# Patient Record
Sex: Female | Born: 1950 | Race: White | Hispanic: No | Smoking: Never smoker
Health system: Southern US, Community
[De-identification: ages and names within clinical notes are randomized; demographics above are authoritative.]

## PROBLEM LIST (undated history)

## (undated) DIAGNOSIS — M1990 Unspecified osteoarthritis, unspecified site: Secondary | ICD-10-CM

## (undated) DIAGNOSIS — M810 Age-related osteoporosis without current pathological fracture: Secondary | ICD-10-CM

## (undated) DIAGNOSIS — F329 Major depressive disorder, single episode, unspecified: Secondary | ICD-10-CM

## (undated) DIAGNOSIS — K219 Gastro-esophageal reflux disease without esophagitis: Secondary | ICD-10-CM

## (undated) DIAGNOSIS — T7840XA Allergy, unspecified, initial encounter: Secondary | ICD-10-CM

## (undated) DIAGNOSIS — E079 Disorder of thyroid, unspecified: Secondary | ICD-10-CM

## (undated) DIAGNOSIS — F419 Anxiety disorder, unspecified: Secondary | ICD-10-CM

## (undated) DIAGNOSIS — E785 Hyperlipidemia, unspecified: Secondary | ICD-10-CM

## (undated) HISTORY — DX: Major depressive disorder, single episode, unspecified: F32.9

## (undated) HISTORY — DX: Gastro-esophageal reflux disease without esophagitis: K21.9

## (undated) HISTORY — DX: Disorder of thyroid, unspecified: E07.9

## (undated) HISTORY — DX: Age-related osteoporosis without current pathological fracture: M81.0

## (undated) HISTORY — DX: Unspecified osteoarthritis, unspecified site: M19.90

## (undated) HISTORY — PX: BACK SURGERY: SHX140

## (undated) HISTORY — PX: FOOT SURGERY: SHX648

## (undated) HISTORY — PX: COLONOSCOPY: SHX174

## (undated) HISTORY — PX: CERVICAL SPINE SURGERY: SHX589

## (undated) HISTORY — DX: Hyperlipidemia, unspecified: E78.5

## (undated) HISTORY — DX: Allergy, unspecified, initial encounter: T78.40XA

## (undated) HISTORY — DX: Anxiety disorder, unspecified: F41.9

---

## 2003-03-11 ENCOUNTER — Encounter: Admission: RE | Admit: 2003-03-11 | Discharge: 2003-03-11 | Payer: Self-pay | Admitting: Optometry

## 2003-04-05 ENCOUNTER — Emergency Department (HOSPITAL_COMMUNITY): Admission: EM | Admit: 2003-04-05 | Discharge: 2003-04-05 | Payer: Self-pay | Admitting: Family Medicine

## 2003-07-15 ENCOUNTER — Emergency Department (HOSPITAL_COMMUNITY): Admission: EM | Admit: 2003-07-15 | Discharge: 2003-07-15 | Payer: Self-pay | Admitting: Emergency Medicine

## 2003-07-16 ENCOUNTER — Emergency Department (HOSPITAL_COMMUNITY): Admission: EM | Admit: 2003-07-16 | Discharge: 2003-07-16 | Payer: Self-pay | Admitting: Emergency Medicine

## 2005-02-13 ENCOUNTER — Emergency Department (HOSPITAL_COMMUNITY): Admission: EM | Admit: 2005-02-13 | Discharge: 2005-02-13 | Payer: Self-pay | Admitting: Family Medicine

## 2005-02-19 ENCOUNTER — Encounter: Admission: RE | Admit: 2005-02-19 | Discharge: 2005-02-19 | Payer: Self-pay | Admitting: Orthopaedic Surgery

## 2005-03-03 ENCOUNTER — Encounter: Admission: RE | Admit: 2005-03-03 | Discharge: 2005-03-03 | Payer: Self-pay | Admitting: Orthopaedic Surgery

## 2005-11-10 ENCOUNTER — Encounter: Admission: RE | Admit: 2005-11-10 | Discharge: 2005-11-10 | Payer: Self-pay | Admitting: Orthopaedic Surgery

## 2005-11-21 ENCOUNTER — Encounter: Admission: RE | Admit: 2005-11-21 | Discharge: 2005-11-21 | Payer: Self-pay | Admitting: Orthopaedic Surgery

## 2006-03-09 ENCOUNTER — Encounter: Admission: RE | Admit: 2006-03-09 | Discharge: 2006-03-09 | Payer: Self-pay | Admitting: Orthopaedic Surgery

## 2006-03-16 ENCOUNTER — Ambulatory Visit (HOSPITAL_COMMUNITY): Admission: RE | Admit: 2006-03-16 | Discharge: 2006-03-17 | Payer: Self-pay | Admitting: Orthopaedic Surgery

## 2006-03-31 ENCOUNTER — Encounter: Admission: RE | Admit: 2006-03-31 | Discharge: 2006-03-31 | Payer: Self-pay | Admitting: Orthopedic Surgery

## 2006-11-03 ENCOUNTER — Ambulatory Visit (HOSPITAL_COMMUNITY): Admission: RE | Admit: 2006-11-03 | Discharge: 2006-11-03 | Payer: Self-pay | Admitting: Orthopaedic Surgery

## 2010-03-17 ENCOUNTER — Encounter: Payer: Self-pay | Admitting: Neurosurgery

## 2010-03-17 ENCOUNTER — Encounter: Payer: Self-pay | Admitting: Orthopaedic Surgery

## 2010-03-17 ENCOUNTER — Encounter: Payer: Self-pay | Admitting: Orthopedic Surgery

## 2010-05-09 ENCOUNTER — Other Ambulatory Visit: Payer: Self-pay | Admitting: *Deleted

## 2010-05-09 DIAGNOSIS — R51 Headache: Secondary | ICD-10-CM

## 2010-05-15 ENCOUNTER — Other Ambulatory Visit: Payer: Self-pay | Admitting: Otolaryngology

## 2010-05-15 ENCOUNTER — Ambulatory Visit
Admission: RE | Admit: 2010-05-15 | Discharge: 2010-05-15 | Disposition: A | Discharge status: Home / Self Care | Payer: PRIVATE HEALTH INSURANCE | Source: Ambulatory Visit | Attending: Otolaryngology | Admitting: Otolaryngology

## 2010-05-15 ENCOUNTER — Other Ambulatory Visit: Payer: PRIVATE HEALTH INSURANCE

## 2010-05-15 ENCOUNTER — Other Ambulatory Visit: Payer: Self-pay | Admitting: *Deleted

## 2010-05-15 DIAGNOSIS — R51 Headache: Secondary | ICD-10-CM

## 2010-05-15 DIAGNOSIS — R52 Pain, unspecified: Secondary | ICD-10-CM

## 2010-05-16 ENCOUNTER — Other Ambulatory Visit: Payer: Self-pay | Admitting: Otolaryngology

## 2010-05-16 DIAGNOSIS — R52 Pain, unspecified: Secondary | ICD-10-CM

## 2010-05-21 ENCOUNTER — Other Ambulatory Visit: Payer: Self-pay | Admitting: Otolaryngology

## 2010-05-21 DIAGNOSIS — R05 Cough: Secondary | ICD-10-CM

## 2010-05-21 DIAGNOSIS — R059 Cough, unspecified: Secondary | ICD-10-CM

## 2010-07-11 ENCOUNTER — Ambulatory Visit: Payer: PRIVATE HEALTH INSURANCE | Attending: Orthopaedic Surgery | Admitting: Physical Therapy

## 2010-07-11 DIAGNOSIS — R5381 Other malaise: Secondary | ICD-10-CM | POA: Insufficient documentation

## 2010-07-11 DIAGNOSIS — R293 Abnormal posture: Secondary | ICD-10-CM | POA: Insufficient documentation

## 2010-07-11 DIAGNOSIS — IMO0001 Reserved for inherently not codable concepts without codable children: Secondary | ICD-10-CM | POA: Insufficient documentation

## 2010-07-11 DIAGNOSIS — M545 Low back pain, unspecified: Secondary | ICD-10-CM | POA: Insufficient documentation

## 2010-07-12 NOTE — Op Note (Signed)
NAMEJENAVIVE, LAMBOY                ACCOUNT NO.:  [id number]   MEDICAL RECORD NO.:  [id number]          PATIENT TYPE:  OIB   LOCATION:  5015                         FACILITY:  [facility]   PHYSICIAN:  Mark C. Ophelia Charter, M.D.    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   PROCEDURE:  C5-6 anterior cervical diskectomy and fusion with allograft  and plating.   SURGEON:  Mark C. Ophelia Charter, M.D.   ASSISTANT:  Wende Neighbors, P.A.-C.   ANESTHESIA:  General.   ESTIMATED BLOOD LOSS:  Minimal.   DESCRIPTION OF PROCEDURE:  After induction of general anesthesia and  orotracheal intubation, preoperative antibiotics, head halter,  horseshoe, DuraPrep.  Area was squared with towels.  Sterile skin  marker.  Betadine Vi-Drape application and thyroid sheets and drapes.  Incision made starting at the midline and extending to the left.  The  platysma was split in line with the fibers.  Blunt dissection down to  the level of the longus colli, with spreading and a short 25 needle in  the C5-6 disk space, confirming the appropriate level.  Self-retaining  Cloward retractors were placed right and left, after the cross-table  lateral x-ray confirmed this was the C5-6 level.  Diskectomy was  performed with a 15 blade and micropituitary as well as Cloward curets.  Endplates were prepared with the power bur with irrigation, removing the  anterior spurs, and most of the endplate was prepared by hand curetting.  The posterior longitudinal ligament was taken down and there was disk  material medially that protected out from the left side, which was  subligamentous and was a large fragment.  Once this was decompressed,  the dura was completely visualized.  There  was a small amount of  protrusion on the right side.  After decompression and stripping both  gutters, sizing and preparation, with the 7 giving the appropriate fit,  an 18-mm plate was selected and initially was slight too high, adjusted  distally, and then was a little bit too low, and then adjusted back up  to the proper position, and then all screws were filled using the Biomet  plate with locking screws.  This was a VueLock plate with 40-HK screws.  The microscope was draped during the microdiskectomy, taking down the  posterior longitudinal ligament and removing the disk fragments.  The  scope was removed, while placing the interbody graft and plate and screw  fixation.  Appropriate position was confirmed with C-arm, A/P and  lateral, and after irrigation with saline solution, a Hemovac was placed  in line with the skin incision with in-and-out technique.  The platysma  was closed with 3-0 and 4-0 Vicryl subcuticular closure.  Tincture of  benzoin and Steri-Strips, postop dressing, tape and a soft cervical  collar.      Mark C. Ophelia Charter, M.D.  Electronically Signed  MCY/MEDQ  D:  03/16/2006  T:  03/16/2006  Job:  [id number]

## 2010-07-17 ENCOUNTER — Encounter: Payer: PRIVATE HEALTH INSURANCE | Admitting: Physical Therapy

## 2010-07-19 ENCOUNTER — Ambulatory Visit: Payer: PRIVATE HEALTH INSURANCE | Admitting: Rehabilitative and Restorative Service Providers"

## 2010-07-23 ENCOUNTER — Encounter: Payer: PRIVATE HEALTH INSURANCE | Admitting: Physical Therapy

## 2010-07-25 ENCOUNTER — Encounter: Payer: PRIVATE HEALTH INSURANCE | Admitting: Physical Therapy

## 2010-07-29 ENCOUNTER — Ambulatory Visit: Payer: PRIVATE HEALTH INSURANCE | Attending: Orthopaedic Surgery | Admitting: Physical Therapy

## 2010-07-29 DIAGNOSIS — R293 Abnormal posture: Secondary | ICD-10-CM | POA: Insufficient documentation

## 2010-07-29 DIAGNOSIS — R5381 Other malaise: Secondary | ICD-10-CM | POA: Insufficient documentation

## 2010-07-29 DIAGNOSIS — M545 Low back pain, unspecified: Secondary | ICD-10-CM | POA: Insufficient documentation

## 2010-07-29 DIAGNOSIS — IMO0001 Reserved for inherently not codable concepts without codable children: Secondary | ICD-10-CM | POA: Insufficient documentation

## 2010-07-31 ENCOUNTER — Ambulatory Visit: Payer: PRIVATE HEALTH INSURANCE | Admitting: Physical Therapy

## 2010-08-01 ENCOUNTER — Other Ambulatory Visit (HOSPITAL_COMMUNITY): Payer: Self-pay | Admitting: Orthopaedic Surgery

## 2010-08-01 DIAGNOSIS — M545 Low back pain: Secondary | ICD-10-CM

## 2010-08-05 ENCOUNTER — Ambulatory Visit: Payer: PRIVATE HEALTH INSURANCE | Admitting: Physical Therapy

## 2010-08-08 ENCOUNTER — Ambulatory Visit (HOSPITAL_COMMUNITY)
Admission: RE | Admit: 2010-08-08 | Discharge: 2010-08-08 | Disposition: A | Discharge status: Home / Self Care | Payer: PRIVATE HEALTH INSURANCE | Source: Ambulatory Visit | Attending: Orthopaedic Surgery | Admitting: Orthopaedic Surgery

## 2010-08-08 ENCOUNTER — Encounter: Payer: PRIVATE HEALTH INSURANCE | Admitting: Physical Therapy

## 2010-08-08 DIAGNOSIS — G589 Mononeuropathy, unspecified: Secondary | ICD-10-CM | POA: Insufficient documentation

## 2010-08-08 DIAGNOSIS — M79609 Pain in unspecified limb: Secondary | ICD-10-CM | POA: Insufficient documentation

## 2010-08-08 DIAGNOSIS — M545 Low back pain, unspecified: Secondary | ICD-10-CM | POA: Insufficient documentation

## 2010-08-08 DIAGNOSIS — M51379 Other intervertebral disc degeneration, lumbosacral region without mention of lumbar back pain or lower extremity pain: Secondary | ICD-10-CM | POA: Insufficient documentation

## 2010-08-08 DIAGNOSIS — M5137 Other intervertebral disc degeneration, lumbosacral region: Secondary | ICD-10-CM | POA: Insufficient documentation

## 2010-08-12 ENCOUNTER — Encounter: Payer: PRIVATE HEALTH INSURANCE | Admitting: Physical Therapy

## 2010-08-14 ENCOUNTER — Encounter: Payer: PRIVATE HEALTH INSURANCE | Admitting: Physical Therapy

## 2010-08-23 ENCOUNTER — Emergency Department (HOSPITAL_COMMUNITY)
Admission: EM | Admit: 2010-08-23 | Discharge: 2010-08-23 | Disposition: A | Discharge status: Home / Self Care | Payer: PRIVATE HEALTH INSURANCE | Attending: Emergency Medicine | Admitting: Emergency Medicine

## 2010-08-23 DIAGNOSIS — R197 Diarrhea, unspecified: Secondary | ICD-10-CM | POA: Insufficient documentation

## 2010-08-23 DIAGNOSIS — R109 Unspecified abdominal pain: Secondary | ICD-10-CM | POA: Insufficient documentation

## 2010-08-23 LAB — URINE MICROSCOPIC-ADD ON

## 2010-08-23 LAB — DIFFERENTIAL
Band Neutrophils: 0 % (ref 0–10)
Basophils Relative: 0 % (ref 0–1)
Eosinophils Absolute: 0 10*3/uL (ref 0.0–0.7)
Eosinophils Relative: 0 % (ref 0–5)
Lymphocytes Relative: 4 % — ABNORMAL LOW (ref 12–46)
Lymphs Abs: 0.2 10*3/uL — ABNORMAL LOW (ref 0.7–4.0)
Metamyelocytes Relative: 0 %
Monocytes Relative: 5 % (ref 3–12)
Myelocytes: 0 %
Neutro Abs: 4.9 10*3/uL (ref 1.7–7.7)
Promyelocytes Absolute: 0 %
nRBC: 0 /100 WBC

## 2010-08-23 LAB — COMPREHENSIVE METABOLIC PANEL
Albumin: 3.9 g/dL (ref 3.5–5.2)
BUN: 23 mg/dL (ref 6–23)
Calcium: 9.1 mg/dL (ref 8.4–10.5)
Creatinine, Ser: 0.77 mg/dL (ref 0.50–1.10)
GFR calc Af Amer: >60 mL/min (ref >60)
Potassium: 4.5 mEq/L (ref 3.5–5.1)
Total Protein: 6.6 g/dL (ref 6.0–8.3)

## 2010-08-23 LAB — URINALYSIS, ROUTINE W REFLEX MICROSCOPIC
Glucose, UA: NEGATIVE mg/dL
Nitrite: NEGATIVE
Specific Gravity, Urine: 1.023 (ref 1.005–1.030)
pH: 6 (ref 5.0–8.0)

## 2010-08-23 LAB — CBC
HCT: 41.1 % (ref 36.0–46.0)
Hemoglobin: 14.6 g/dL (ref 12.0–15.0)
MCH: 32.2 pg (ref 26.0–34.0)
MCHC: 35.5 g/dL (ref 30.0–36.0)
MCV: 90.5 fL (ref 78.0–100.0)
RDW: 12.5 % (ref 11.5–15.5)
WBC: 5.4 10*3/uL (ref 4.0–10.5)

## 2010-08-23 LAB — CLOSTRIDIUM DIFFICILE BY PCR: Toxigenic C. Difficile by PCR: NEGATIVE

## 2010-08-26 LAB — STOOL CULTURE

## 2011-05-20 DIAGNOSIS — M533 Sacrococcygeal disorders, not elsewhere classified: Secondary | ICD-10-CM

## 2011-05-20 HISTORY — DX: Sacrococcygeal disorders, not elsewhere classified: M53.3

## 2012-07-12 DIAGNOSIS — M533 Sacrococcygeal disorders, not elsewhere classified: Secondary | ICD-10-CM | POA: Insufficient documentation

## 2012-07-12 HISTORY — DX: Sacrococcygeal disorders, not elsewhere classified: M53.3

## 2012-07-31 DIAGNOSIS — F09 Unspecified mental disorder due to known physiological condition: Secondary | ICD-10-CM | POA: Insufficient documentation

## 2012-10-06 ENCOUNTER — Other Ambulatory Visit: Payer: Self-pay | Admitting: Orthopaedic Surgery

## 2012-10-06 DIAGNOSIS — M549 Dorsalgia, unspecified: Secondary | ICD-10-CM

## 2012-10-14 ENCOUNTER — Other Ambulatory Visit: Payer: PRIVATE HEALTH INSURANCE

## 2012-10-21 ENCOUNTER — Other Ambulatory Visit: Payer: PRIVATE HEALTH INSURANCE

## 2012-10-21 ENCOUNTER — Inpatient Hospital Stay
Admission: RE | Admit: 2012-10-21 | Discharge: 2012-10-21 | Disposition: A | Discharge status: Home / Self Care | Payer: PRIVATE HEALTH INSURANCE | Source: Ambulatory Visit | Attending: Orthopaedic Surgery | Admitting: Orthopaedic Surgery

## 2012-11-03 ENCOUNTER — Ambulatory Visit
Admission: RE | Admit: 2012-11-03 | Discharge: 2012-11-03 | Disposition: A | Discharge status: Home / Self Care | Payer: PRIVATE HEALTH INSURANCE | Source: Ambulatory Visit | Attending: Orthopaedic Surgery | Admitting: Orthopaedic Surgery

## 2012-11-03 VITALS — BP 94/54 | HR 68

## 2012-11-03 DIAGNOSIS — M549 Dorsalgia, unspecified: Secondary | ICD-10-CM

## 2012-11-03 MED ORDER — IOHEXOL 180 MG/ML  SOLN
18.0000 mL | Freq: Once | INTRAMUSCULAR | Status: AC | PRN
  Administered 2012-11-03: 18 mL via INTRATHECAL

## 2012-11-03 MED ORDER — DIAZEPAM 5 MG PO TABS
5.0000 mg | ORAL_TABLET | Freq: Once | ORAL | Status: AC
  Administered 2012-11-03: 5 mg via ORAL

## 2012-11-03 NOTE — Progress Notes (Signed)
Pt states she has been off citalopram for the past 2 days. Discharge instructions explained to pt and husband.

## 2012-11-04 ENCOUNTER — Telehealth: Payer: Self-pay | Admitting: Radiology

## 2012-11-04 NOTE — Telephone Encounter (Signed)
Pt had myelo yesterday and called c/o nausea today. Pt denies any headache but has not restarted her citalopram. Explained it could be side effect of not taking her meds and she should go back on them since she had not resumed it this AM as instructed.

## 2012-12-28 DIAGNOSIS — F411 Generalized anxiety disorder: Secondary | ICD-10-CM

## 2012-12-28 HISTORY — DX: Generalized anxiety disorder: F41.1

## 2013-03-16 ENCOUNTER — Encounter: Payer: Self-pay | Admitting: Podiatrist

## 2013-03-16 ENCOUNTER — Ambulatory Visit (INDEPENDENT_AMBULATORY_CARE_PROVIDER_SITE_OTHER): Payer: No Typology Code available for payment source | Admitting: Podiatrist

## 2013-03-16 VITALS — BP 96/57 | HR 70 | Resp 18

## 2013-03-16 DIAGNOSIS — L03039 Cellulitis of unspecified toe: Secondary | ICD-10-CM

## 2013-03-16 MED ORDER — CEPHALEXIN 500 MG PO CAPS: 500.0000 mg | ORAL_CAPSULE | Freq: Three times a day (TID) | ORAL | Status: DC

## 2013-03-16 NOTE — Patient Instructions (Signed)
Soak Instructions    THE DAY AFTER THE PROCEDURE  Place 1/4 cup of epsom salts in a quart of warm tap water.  Submerge your foot or feet with outer bandage intact for the initial soak; this will allow the bandage to become moist and wet for easy lift off.  Once you remove your bandage, continue to soak in the solution for 20 minutes.  This soak should be done twice a day.  Next, remove your foot or feet from solution, blot dry the affected area and cover.  You may use a band aid large enough to cover the area or use gauze and tape.  Apply other medications to the area as directed by the doctor such as polysporin neosporin.  IF YOUR SKIN BECOMES IRRITATED WHILE USING THESE INSTRUCTIONS, IT IS OKAY TO SWITCH TO  WHITE VINEGAR AND WATER. Or you may use antibacterial soap and water to keep the toe clean   After a couple of days switch to antibacterial soap  ANTIBACTERIAL SOAP INSTRUCTIONS  THE DAY AFTER PROCEDURE     Shower as usual. Before getting out, place a drop of antibacterial liquid soap (Dial) on a wet, clean washcloth.  Gently wipe washcloth over affected area.  Afterward, rinse the area with warm water.  Blot the area dry with a soft cloth and cover with antibiotic ointment (neosporin, polysporin, bacitracin) and band aid or gauze and tape  OR   Place 3-4 drops of antibacterial liquid soap in a quart of warm tap water.  Submerge foot into water for 20 minutes.  If bandage was applied after your procedure, leave on to allow for easy lift off, then remove and continue with soak for the remaining time.  Next, blot area dry with a soft cloth and cover with a bandage.  Apply other medications as directed by your doctor, such as cortisporin otic solution (eardrops) or neosporin antibiotic ointment

## 2013-03-16 NOTE — Progress Notes (Signed)
   Subjective:    Patient ID: Martha Harrell, female    DOB: 05-14-1950, 63 y.o.   MRN: [id number]  HPI my right big toenail has been going on for about a week and sore and tender and is a little red and doesn't hurt with shoes    Review of Systems  Constitutional: Negative.   HENT: Negative.   Eyes: Positive for redness.  Respiratory: Negative.   Cardiovascular: Negative.   Gastrointestinal: Negative.   Endocrine: Negative.   Genitourinary: Positive for frequency.  Musculoskeletal:       Back pain  Skin:       Bulging disc tear L 4 / L 5  Allergic/Immunologic: Negative.   Neurological: Negative.   Hematological: Bruises/bleeds easily.  Psychiatric/Behavioral: Negative.        Objective:   Physical Exam GENERAL APPEARANCE: Alert, conversant. Appropriately groomed. No acute distress.  VASCULAR: Pedal pulses palpable and strong bilateral.  Capillary refill time is immediate to all digits,  Proximal to distal cooling it warm to warm.  Digital hair growth is present bilateral  NEUROLOGIC: sensation is intact epicritically and protectively to 5.07 monofilament at 5/5 sites bilateral.  Light touch is intact bilateral, vibratory sensation intact bilateral, achilles tendon reflex is intact bilateral.  MUSCULOSKELETAL: acceptable muscle strength, tone and stability bilateral.  Intrinsic muscluature intact bilateral.  Rectus appearance of foot and digits noted bilateral.   DERMATOLOGIC: Left hallux nail is red and swollen at the proximal lateral nail border. No active pus or purulence is noted no drainage is seen however the proximal nail fold and laterally is inflamed and irritated The remained her of the dermatological examination is within normal limits.      Assessment & Plan:  Paronychia left hallux nail lateral nail border  Treatment options and alternatives discussed.  Recommended an incision and drainage and patient agreed.  Left hallux was prepped with alcohol and a 1 to 1 mix  of 0.5% marcaine plain and 2% lidocaine plain was administered in a digital block fashion.  The toe was then prepped with betadine solution and exsanguinated.  The offending nail border was then excised and all necrotic tissue was resected.  The area was then cleansed well and antibiotic ointment and a dry sterile dressing was applied.  The patient was dispensed instructions for aftercare.

## 2013-03-21 ENCOUNTER — Telehealth: Payer: Self-pay | Admitting: *Deleted

## 2013-03-21 NOTE — Telephone Encounter (Signed)
PT CALLED BACK.  PT QUESTION WAS HOW LONG DID SHE NEED TO WEAR THE BAND AID?  TOLD PT SHE WOULD WEAR THE BAND AID AS LONG AS THE TOE WAS DRAINING OR UNTIL SHE FELT IT WAS HEALED.  Deniece Rankin CNA

## 2013-03-28 ENCOUNTER — Telehealth: Payer: Self-pay | Admitting: *Deleted

## 2013-03-28 NOTE — Telephone Encounter (Signed)
Pt states the Cephalexin gave her a terrible yeast infection, only has a few more capsules left.  Pt asked if she could stop.  I informed pt, if she only had a 4 capsules left, then stop, I would inform Dr Valentina Lucks and if she wanted her to take the remainder I would call again.  Pt agreed.

## 2013-04-13 ENCOUNTER — Encounter: Payer: Self-pay | Admitting: Podiatry

## 2013-04-13 ENCOUNTER — Ambulatory Visit (INDEPENDENT_AMBULATORY_CARE_PROVIDER_SITE_OTHER): Payer: Medicare Other | Admitting: Podiatry

## 2013-04-13 VITALS — BP 107/67 | HR 73 | Resp 16

## 2013-04-13 DIAGNOSIS — L03039 Cellulitis of unspecified toe: Secondary | ICD-10-CM

## 2013-04-13 NOTE — Progress Notes (Signed)
Subjective:     Patient ID: Martha Harrell, female   DOB: November 09, 1950, 63 y.o.   MRN: [id number]  HPI patient presents stating I was just concerned about this right big toe. States that there is been a little bit of cross formation and redness and she wanted to make sure it's not infected   Review of Systems     Objective:   Physical Exam Neurovascular status intact with mild erythema around the proximal nail fold hallux right side with a small amount of crusted tissue that is localized. No active drainage no proximal erythema edema or lymph node distention    Assessment:     Mild paronychia infection right hallux medial side localized in nature    Plan:     Discussed antibiotics which she would like to avoid at this current time as they give her yeast infections. We will switch soaks to Betadine and she will do that everyday for the next couple weeks and if symptoms do not get better or certainly if they get worse she is to contact us come in and we will start her on oral antibiotics at that time.

## 2013-04-13 NOTE — Patient Instructions (Signed)

## 2013-04-20 ENCOUNTER — Telehealth: Payer: Self-pay | Admitting: *Deleted

## 2013-04-20 MED ORDER — FLUCONAZOLE 150 MG PO TABS: 150.0000 mg | ORAL_TABLET | Freq: Once | ORAL | Status: DC

## 2013-04-20 MED ORDER — CEPHALEXIN 500 MG PO CAPS: 500.0000 mg | ORAL_CAPSULE | Freq: Three times a day (TID) | ORAL | Status: DC

## 2013-04-20 NOTE — Telephone Encounter (Addendum)
Pt states right big toe is no better, was told to call for an antibiotic and Diflucan.  Dr Paulla Dolly orderd Cephalexin 500mg  #30 one capsule po tid, and Diflucan 150mg  #1 one tablet once daily.  Orders electronically sent and called to pt.

## 2013-04-21 ENCOUNTER — Other Ambulatory Visit: Payer: Self-pay | Admitting: Podiatry

## 2013-04-21 ENCOUNTER — Telehealth: Payer: Self-pay | Admitting: *Deleted

## 2013-04-21 MED ORDER — FLUCONAZOLE 150 MG PO TABS: 150.0000 mg | ORAL_TABLET | Freq: Once | ORAL | Status: DC

## 2013-04-21 NOTE — Telephone Encounter (Signed)
Pt states she needs more than 1 Diflucan, because of the severity of the yeast infection, and request 3 total.  DR Paulla Dolly states fine.  Ordered 2 additional Diflucan electronically and informed pt.

## 2013-04-29 ENCOUNTER — Telehealth: Payer: Self-pay | Admitting: *Deleted

## 2013-04-29 NOTE — Telephone Encounter (Signed)
Pt states her toe is not getting any better and she wants to be seen by which ever doctor who can see her first.  I referred to [facility].

## 2013-04-29 NOTE — Telephone Encounter (Signed)
SCHEDULED ON 05/02/13 W/ DR Paulla Dolly

## 2013-05-02 ENCOUNTER — Encounter: Payer: Self-pay | Admitting: Podiatry

## 2013-05-02 ENCOUNTER — Ambulatory Visit (INDEPENDENT_AMBULATORY_CARE_PROVIDER_SITE_OTHER): Payer: Medicare Other | Admitting: Podiatry

## 2013-05-02 VITALS — BP 104/61 | HR 69 | Resp 16

## 2013-05-02 DIAGNOSIS — L03039 Cellulitis of unspecified toe: Secondary | ICD-10-CM

## 2013-05-02 NOTE — Progress Notes (Signed)
Today is the last day of my antibiotic , it has improved some , but it is still not well

## 2013-05-02 NOTE — Progress Notes (Signed)
Subjective:     Patient ID: Martha Harrell, female   DOB: Jan 19, 1951, 62 y.o.   MRN: [id number]  HPI patient presents stating I am just concerned about  big toe on my right foot   Review of Systems     Objective:   Physical Exam Neurovascular status intact with mild redness around the proximal nail fold right medial side with no edema no drainage odor or pain when pressed with no proximal edema erythema or lymph node distention    Assessment:     Possible very mild paronychia infection versus normal discoloration of the proximal nail fold    Plan:     No significant clinical symptoms so we will continue to watch this and if any increased redness drainage pain should occur patient is to reappoint

## 2013-05-06 ENCOUNTER — Other Ambulatory Visit: Payer: Self-pay | Admitting: Podiatry

## 2013-06-28 ENCOUNTER — Other Ambulatory Visit: Payer: Self-pay | Admitting: Physician Assistant

## 2013-09-22 DIAGNOSIS — M51379 Other intervertebral disc degeneration, lumbosacral region without mention of lumbar back pain or lower extremity pain: Secondary | ICD-10-CM

## 2013-09-22 DIAGNOSIS — M545 Low back pain, unspecified: Secondary | ICD-10-CM | POA: Insufficient documentation

## 2013-09-22 DIAGNOSIS — M5137 Other intervertebral disc degeneration, lumbosacral region: Secondary | ICD-10-CM

## 2013-09-22 HISTORY — DX: Other intervertebral disc degeneration, lumbosacral region without mention of lumbar back pain or lower extremity pain: M51.379

## 2013-09-22 HISTORY — DX: Other intervertebral disc degeneration, lumbosacral region: M51.37

## 2013-09-22 HISTORY — DX: Low back pain, unspecified: M54.50

## 2015-02-27 DIAGNOSIS — F411 Generalized anxiety disorder: Secondary | ICD-10-CM | POA: Diagnosis not present

## 2015-02-27 DIAGNOSIS — F331 Major depressive disorder, recurrent, moderate: Secondary | ICD-10-CM | POA: Diagnosis not present

## 2015-03-07 DIAGNOSIS — F329 Major depressive disorder, single episode, unspecified: Secondary | ICD-10-CM | POA: Diagnosis not present

## 2015-03-12 DIAGNOSIS — Z0000 Encounter for general adult medical examination without abnormal findings: Secondary | ICD-10-CM | POA: Diagnosis not present

## 2015-03-12 DIAGNOSIS — E7800 Pure hypercholesterolemia, unspecified: Secondary | ICD-10-CM | POA: Diagnosis not present

## 2015-03-14 DIAGNOSIS — K589 Irritable bowel syndrome without diarrhea: Secondary | ICD-10-CM

## 2015-03-14 DIAGNOSIS — E042 Nontoxic multinodular goiter: Secondary | ICD-10-CM

## 2015-03-14 DIAGNOSIS — E785 Hyperlipidemia, unspecified: Secondary | ICD-10-CM

## 2015-03-14 DIAGNOSIS — N952 Postmenopausal atrophic vaginitis: Secondary | ICD-10-CM | POA: Insufficient documentation

## 2015-03-14 DIAGNOSIS — K219 Gastro-esophageal reflux disease without esophagitis: Secondary | ICD-10-CM | POA: Insufficient documentation

## 2015-03-14 DIAGNOSIS — R519 Headache, unspecified: Secondary | ICD-10-CM

## 2015-03-14 DIAGNOSIS — E2740 Unspecified adrenocortical insufficiency: Secondary | ICD-10-CM

## 2015-03-14 DIAGNOSIS — R42 Dizziness and giddiness: Secondary | ICD-10-CM

## 2015-03-14 DIAGNOSIS — J309 Allergic rhinitis, unspecified: Secondary | ICD-10-CM

## 2015-03-14 DIAGNOSIS — M810 Age-related osteoporosis without current pathological fracture: Secondary | ICD-10-CM

## 2015-03-14 DIAGNOSIS — I6529 Occlusion and stenosis of unspecified carotid artery: Secondary | ICD-10-CM

## 2015-03-14 HISTORY — DX: Dizziness and giddiness: R42

## 2015-03-14 HISTORY — DX: Occlusion and stenosis of unspecified carotid artery: I65.29

## 2015-03-14 HISTORY — DX: Irritable bowel syndrome, unspecified: K58.9

## 2015-03-14 HISTORY — DX: Age-related osteoporosis without current pathological fracture: M81.0

## 2015-03-14 HISTORY — DX: Allergic rhinitis, unspecified: J30.9

## 2015-03-14 HISTORY — DX: Headache, unspecified: R51.9

## 2015-03-14 HISTORY — DX: Unspecified adrenocortical insufficiency: E27.40

## 2015-03-14 HISTORY — DX: Hyperlipidemia, unspecified: E78.5

## 2015-03-14 HISTORY — DX: Nontoxic multinodular goiter: E04.2

## 2015-03-15 DIAGNOSIS — I6523 Occlusion and stenosis of bilateral carotid arteries: Secondary | ICD-10-CM | POA: Diagnosis not present

## 2015-03-15 DIAGNOSIS — K219 Gastro-esophageal reflux disease without esophagitis: Secondary | ICD-10-CM | POA: Diagnosis not present

## 2015-03-15 DIAGNOSIS — E7800 Pure hypercholesterolemia, unspecified: Secondary | ICD-10-CM | POA: Diagnosis not present

## 2015-03-15 DIAGNOSIS — Z0000 Encounter for general adult medical examination without abnormal findings: Secondary | ICD-10-CM | POA: Diagnosis not present

## 2015-03-15 DIAGNOSIS — M810 Age-related osteoporosis without current pathological fracture: Secondary | ICD-10-CM | POA: Diagnosis not present

## 2015-03-15 DIAGNOSIS — F332 Major depressive disorder, recurrent severe without psychotic features: Secondary | ICD-10-CM | POA: Diagnosis not present

## 2015-03-15 DIAGNOSIS — F411 Generalized anxiety disorder: Secondary | ICD-10-CM | POA: Diagnosis not present

## 2015-03-15 DIAGNOSIS — J302 Other seasonal allergic rhinitis: Secondary | ICD-10-CM | POA: Diagnosis not present

## 2015-03-21 DIAGNOSIS — Z780 Asymptomatic menopausal state: Secondary | ICD-10-CM | POA: Diagnosis not present

## 2015-03-21 DIAGNOSIS — Z13820 Encounter for screening for osteoporosis: Secondary | ICD-10-CM | POA: Diagnosis not present

## 2015-03-21 DIAGNOSIS — G894 Chronic pain syndrome: Secondary | ICD-10-CM | POA: Diagnosis not present

## 2015-03-21 DIAGNOSIS — M810 Age-related osteoporosis without current pathological fracture: Secondary | ICD-10-CM | POA: Diagnosis not present

## 2015-03-21 DIAGNOSIS — M5137 Other intervertebral disc degeneration, lumbosacral region: Secondary | ICD-10-CM | POA: Diagnosis not present

## 2015-03-21 DIAGNOSIS — G8929 Other chronic pain: Secondary | ICD-10-CM | POA: Diagnosis not present

## 2015-03-21 DIAGNOSIS — M533 Sacrococcygeal disorders, not elsewhere classified: Secondary | ICD-10-CM | POA: Diagnosis not present

## 2015-03-21 DIAGNOSIS — M545 Low back pain: Secondary | ICD-10-CM | POA: Diagnosis not present

## 2015-03-30 DIAGNOSIS — R87615 Unsatisfactory cytologic smear of cervix: Secondary | ICD-10-CM | POA: Diagnosis not present

## 2015-03-30 DIAGNOSIS — Z01419 Encounter for gynecological examination (general) (routine) without abnormal findings: Secondary | ICD-10-CM | POA: Diagnosis not present

## 2015-04-30 DIAGNOSIS — M810 Age-related osteoporosis without current pathological fracture: Secondary | ICD-10-CM | POA: Diagnosis not present

## 2015-05-16 DIAGNOSIS — M7742 Metatarsalgia, left foot: Secondary | ICD-10-CM | POA: Diagnosis not present

## 2015-05-16 DIAGNOSIS — M79672 Pain in left foot: Secondary | ICD-10-CM | POA: Diagnosis not present

## 2015-05-17 DIAGNOSIS — F424 Excoriation (skin-picking) disorder: Secondary | ICD-10-CM | POA: Diagnosis not present

## 2015-05-19 DIAGNOSIS — Z86010 Personal history of colonic polyps: Secondary | ICD-10-CM

## 2015-05-19 DIAGNOSIS — Z860101 Personal history of adenomatous and serrated colon polyps: Secondary | ICD-10-CM

## 2015-05-19 HISTORY — DX: Personal history of adenomatous and serrated colon polyps: Z86.0101

## 2015-05-19 HISTORY — DX: Personal history of colonic polyps: Z86.010

## 2015-06-06 DIAGNOSIS — D131 Benign neoplasm of stomach: Secondary | ICD-10-CM

## 2015-06-06 HISTORY — DX: Benign neoplasm of stomach: D13.1

## 2015-06-20 DIAGNOSIS — J302 Other seasonal allergic rhinitis: Secondary | ICD-10-CM | POA: Diagnosis not present

## 2015-06-27 DIAGNOSIS — L820 Inflamed seborrheic keratosis: Secondary | ICD-10-CM | POA: Diagnosis not present

## 2015-06-27 DIAGNOSIS — L821 Other seborrheic keratosis: Secondary | ICD-10-CM | POA: Diagnosis not present

## 2015-06-28 DIAGNOSIS — F331 Major depressive disorder, recurrent, moderate: Secondary | ICD-10-CM | POA: Diagnosis not present

## 2015-06-28 DIAGNOSIS — F411 Generalized anxiety disorder: Secondary | ICD-10-CM | POA: Diagnosis not present

## 2015-06-29 DIAGNOSIS — J029 Acute pharyngitis, unspecified: Secondary | ICD-10-CM | POA: Diagnosis not present

## 2015-06-29 DIAGNOSIS — J302 Other seasonal allergic rhinitis: Secondary | ICD-10-CM | POA: Diagnosis not present

## 2015-06-30 DIAGNOSIS — J069 Acute upper respiratory infection, unspecified: Secondary | ICD-10-CM | POA: Diagnosis not present

## 2015-07-18 DIAGNOSIS — F329 Major depressive disorder, single episode, unspecified: Secondary | ICD-10-CM | POA: Diagnosis not present

## 2015-07-18 DIAGNOSIS — F411 Generalized anxiety disorder: Secondary | ICD-10-CM | POA: Diagnosis not present

## 2015-07-30 DIAGNOSIS — Z86010 Personal history of colonic polyps: Secondary | ICD-10-CM | POA: Diagnosis not present

## 2015-07-30 DIAGNOSIS — R1314 Dysphagia, pharyngoesophageal phase: Secondary | ICD-10-CM | POA: Diagnosis not present

## 2015-08-22 DIAGNOSIS — K222 Esophageal obstruction: Secondary | ICD-10-CM | POA: Diagnosis not present

## 2015-08-22 DIAGNOSIS — R1314 Dysphagia, pharyngoesophageal phase: Secondary | ICD-10-CM | POA: Diagnosis not present

## 2015-08-22 DIAGNOSIS — K635 Polyp of colon: Secondary | ICD-10-CM | POA: Diagnosis not present

## 2015-08-22 DIAGNOSIS — Z86010 Personal history of colonic polyps: Secondary | ICD-10-CM | POA: Diagnosis not present

## 2015-08-22 DIAGNOSIS — K649 Unspecified hemorrhoids: Secondary | ICD-10-CM | POA: Diagnosis not present

## 2015-08-22 DIAGNOSIS — Z1211 Encounter for screening for malignant neoplasm of colon: Secondary | ICD-10-CM | POA: Diagnosis not present

## 2015-08-22 DIAGNOSIS — D122 Benign neoplasm of ascending colon: Secondary | ICD-10-CM | POA: Diagnosis not present

## 2015-10-15 DIAGNOSIS — G5762 Lesion of plantar nerve, left lower limb: Secondary | ICD-10-CM | POA: Diagnosis not present

## 2015-10-15 DIAGNOSIS — M2022 Hallux rigidus, left foot: Secondary | ICD-10-CM | POA: Diagnosis not present

## 2015-10-15 DIAGNOSIS — M7742 Metatarsalgia, left foot: Secondary | ICD-10-CM | POA: Diagnosis not present

## 2015-11-05 DIAGNOSIS — M84375D Stress fracture, left foot, subsequent encounter for fracture with routine healing: Secondary | ICD-10-CM | POA: Diagnosis not present

## 2015-11-06 ENCOUNTER — Other Ambulatory Visit: Payer: Self-pay | Admitting: Sports Medicine

## 2015-11-06 DIAGNOSIS — M79672 Pain in left foot: Secondary | ICD-10-CM

## 2015-11-14 ENCOUNTER — Ambulatory Visit
Admission: RE | Admit: 2015-11-14 | Discharge: 2015-11-14 | Disposition: A | Discharge status: Home / Self Care | Payer: PPO | Source: Ambulatory Visit | Attending: Sports Medicine | Admitting: Sports Medicine

## 2015-11-14 DIAGNOSIS — M79672 Pain in left foot: Secondary | ICD-10-CM

## 2015-11-14 DIAGNOSIS — S92322D Displaced fracture of second metatarsal bone, left foot, subsequent encounter for fracture with routine healing: Secondary | ICD-10-CM | POA: Diagnosis not present

## 2015-11-20 DIAGNOSIS — Z780 Asymptomatic menopausal state: Secondary | ICD-10-CM | POA: Diagnosis not present

## 2015-11-20 DIAGNOSIS — M2022 Hallux rigidus, left foot: Secondary | ICD-10-CM | POA: Diagnosis not present

## 2015-11-20 DIAGNOSIS — M71572 Other bursitis, not elsewhere classified, left ankle and foot: Secondary | ICD-10-CM | POA: Diagnosis not present

## 2015-11-20 DIAGNOSIS — Z1231 Encounter for screening mammogram for malignant neoplasm of breast: Secondary | ICD-10-CM | POA: Diagnosis not present

## 2015-11-20 DIAGNOSIS — M810 Age-related osteoporosis without current pathological fracture: Secondary | ICD-10-CM | POA: Diagnosis not present

## 2015-11-20 DIAGNOSIS — M84375D Stress fracture, left foot, subsequent encounter for fracture with routine healing: Secondary | ICD-10-CM | POA: Diagnosis not present

## 2015-12-10 ENCOUNTER — Encounter: Payer: Self-pay | Admitting: Sports Medicine

## 2015-12-10 ENCOUNTER — Encounter: Payer: Self-pay | Admitting: *Deleted

## 2015-12-10 ENCOUNTER — Ambulatory Visit (INDEPENDENT_AMBULATORY_CARE_PROVIDER_SITE_OTHER): Payer: PPO | Admitting: Sports Medicine

## 2015-12-10 VITALS — BP 106/58 | Ht 64.0 in | Wt 144.0 lb

## 2015-12-10 DIAGNOSIS — M25579 Pain in unspecified ankle and joints of unspecified foot: Secondary | ICD-10-CM

## 2015-12-10 NOTE — Progress Notes (Signed)
   Subjective:    Patient ID: Martha Harrell, female    DOB: [birth year], 65 y.o.   MRN: [id number]  HPI chief complaint: Left foot pain  65 year old female sent over the request of Dr. Paulla Fore for custom orthotics. She has a history of chronic left foot and ankle problems. She has a history of a remote fracture in her left foot but she is unable to tell me exactly what type of fracture she had. It was treated conservatively with a postop shoe. No surgery. Dr. Paulla Fore recently put metatarsal pads on some off-the-shelf orthotics. This has been somewhat helpful. Most of her pain is in the dorsum of her foot along the metatarsals. She does note some swelling on occasion as well.  Past medical history reviewed Medications reviewed Allergies reviewed    Review of Systems    as above Objective:   Physical Exam  Well-developed, well-nourished. No acute distress. Alert and oriented 3.  Examination of her feet in the standing position shows a narrow foot. Fairly well-preserved longitudinal arch but collapse of the transverse arches bilaterally. I do not appreciate any significant callus buildup along the plantar surface of either foot. She does have some mild diffuse soft tissue swelling across the dorsum of the left foot and around the left ankle. Slight tenderness to palpation diffusely as well but nothing focal. Good pulses. She walks without a limp.       Assessment & Plan:   Chronic bilateral foot pain  Custom orthotics were constructed for the patient today. She found them to be comfortable prior to leaving the office. She will follow-up with Dr. Paulla Fore per his request and will follow-up with me as needed.  Total time spent with the patient was 30 minutes with greater than 50% of the time spent in face-to-face consultation discussing orthotic construction, instruction, and fitting.  Patient was fitted for a : standard, cushioned, semi-rigid orthotic. The orthotic was heated and afterward  the patient stood on the orthotic blank positioned on the orthotic stand. The patient was positioned in subtalar neutral position and 10 degrees of ankle dorsiflexion in a weight bearing stance. After completion of molding, a stable base was applied to the orthotic blank. The blank was ground to a stable position for weight bearing. Size: 7 Base: Blue EVA Posting: none Additional orthotic padding: B/L metatarsal pads

## 2015-12-17 DIAGNOSIS — F411 Generalized anxiety disorder: Secondary | ICD-10-CM | POA: Diagnosis not present

## 2015-12-17 DIAGNOSIS — F329 Major depressive disorder, single episode, unspecified: Secondary | ICD-10-CM | POA: Diagnosis not present

## 2015-12-19 DIAGNOSIS — G8929 Other chronic pain: Secondary | ICD-10-CM | POA: Diagnosis not present

## 2015-12-19 DIAGNOSIS — M533 Sacrococcygeal disorders, not elsewhere classified: Secondary | ICD-10-CM | POA: Diagnosis not present

## 2015-12-19 DIAGNOSIS — M545 Low back pain: Secondary | ICD-10-CM | POA: Diagnosis not present

## 2015-12-19 DIAGNOSIS — M5137 Other intervertebral disc degeneration, lumbosacral region: Secondary | ICD-10-CM | POA: Diagnosis not present

## 2015-12-19 DIAGNOSIS — G894 Chronic pain syndrome: Secondary | ICD-10-CM | POA: Diagnosis not present

## 2015-12-21 DIAGNOSIS — M2022 Hallux rigidus, left foot: Secondary | ICD-10-CM | POA: Diagnosis not present

## 2015-12-28 DIAGNOSIS — H04123 Dry eye syndrome of bilateral lacrimal glands: Secondary | ICD-10-CM | POA: Diagnosis not present

## 2015-12-28 DIAGNOSIS — H52223 Regular astigmatism, bilateral: Secondary | ICD-10-CM | POA: Diagnosis not present

## 2015-12-28 DIAGNOSIS — H5212 Myopia, left eye: Secondary | ICD-10-CM | POA: Diagnosis not present

## 2015-12-28 DIAGNOSIS — H5201 Hypermetropia, right eye: Secondary | ICD-10-CM | POA: Diagnosis not present

## 2015-12-28 DIAGNOSIS — H25013 Cortical age-related cataract, bilateral: Secondary | ICD-10-CM | POA: Diagnosis not present

## 2015-12-28 DIAGNOSIS — H3530 Unspecified macular degeneration: Secondary | ICD-10-CM | POA: Diagnosis not present

## 2016-01-03 DIAGNOSIS — M810 Age-related osteoporosis without current pathological fracture: Secondary | ICD-10-CM | POA: Diagnosis not present

## 2016-02-15 ENCOUNTER — Ambulatory Visit (INDEPENDENT_AMBULATORY_CARE_PROVIDER_SITE_OTHER): Payer: Self-pay | Admitting: Sports Medicine

## 2016-02-22 ENCOUNTER — Ambulatory Visit (INDEPENDENT_AMBULATORY_CARE_PROVIDER_SITE_OTHER): Payer: Self-pay

## 2016-02-22 ENCOUNTER — Encounter (INDEPENDENT_AMBULATORY_CARE_PROVIDER_SITE_OTHER): Payer: Self-pay | Admitting: Surgery

## 2016-02-22 ENCOUNTER — Ambulatory Visit (INDEPENDENT_AMBULATORY_CARE_PROVIDER_SITE_OTHER): Payer: PPO | Admitting: Surgery

## 2016-02-22 VITALS — BP 132/74 | HR 82 | Ht 65.0 in | Wt 140.0 lb

## 2016-02-22 DIAGNOSIS — M25551 Pain in right hip: Secondary | ICD-10-CM

## 2016-02-22 DIAGNOSIS — M7061 Trochanteric bursitis, right hip: Secondary | ICD-10-CM

## 2016-02-22 NOTE — Progress Notes (Signed)
Office Visit Note   Patient: Martha Harrell           Date of Birth: 1950-11-01           MRN: [id number] Visit Date: 02/22/2016              Requested by: Derrill Center, MD [address] [place], [place] [address] PCP: Derrill Center, MD   Assessment & Plan: Visit Diagnoses:  1. Pain in right hip   2. Greater trochanteric bursitis of right hip     Plan: Patient was given IT band stretching exercises. Can use ice off and on as needed. Follow-up the office in 2 weeks for recheck. If she has not had any improvement we did discuss possibly doing a Marcaine/Depo-Medrol hip bursa injection. All questions answered.  Follow-Up Instructions: Return in about 2 weeks (around 03/07/2016).   Orders:  Orders Placed This Encounter  Procedures  . XR HIP UNILAT W OR W/O PELVIS 2-3 VIEWS RIGHT   No orders of the defined types were placed in this encounter.     Procedures: No procedures performed   Clinical Data: No additional findings.   Subjective: Chief Complaint  Patient presents with  . Right Hip - Pain    Martin Majestic out of town to [place] about 5 days ago. Went down flight of stairs, felt electricity bolt go through right hip. She states that she has a history of back problems and sees a neurologist in [facility] for spinal stenosis.  She is on Duloxetine and Gabapentin. This does not feel like a back problem. This feels like it is in her right hip. She does have some arthritis that has come up. She has seen Dr. Paulla Fore for her big toe previously. She took Ibuprofen for the pain yesterday with maybe a little relief.   Patient complains of pain mostly over the right lateral hip. She is ablating. She's had previous right hip ORIF about 20 years ago.  Review of Systems  Constitutional: Negative.   HENT: Negative.   Cardiovascular: Negative.   Musculoskeletal: Negative.   Neurological: Negative.   Psychiatric/Behavioral: Negative.      Objective: Vital Signs: BP 132/74   Pulse  82   Ht 5\' 5"  (1.651 m)   Wt 140 lb (63.5 kg)   BMI 23.30 kg/m   Physical Exam  Constitutional: She is oriented to person, place, and time. No distress.  HENT:  Head: Normocephalic and atraumatic.  Eyes: EOM are normal. Pupils are equal, round, and reactive to light.  Neck: Normal range of motion.  Pulmonary/Chest: No respiratory distress.  Abdominal: She exhibits no distension.  Musculoskeletal:  Patient has a slight Trendelenburg gait. She has moderate to marked tenderness over the right hip greater bursa. Negative logroll. Negative straight leg raise. Bilateral calves are nontender. Neurovascularly intact.  Neurological: She is alert and oriented to person, place, and time.  Skin: Skin is warm and dry.  Psychiatric: She has a normal mood and affect.    Ortho Exam  Specialty Comments:  No specialty comments available.  Imaging: Xr Hip Unilat W Or W/o Pelvis 2-3 Views Right  Result Date: 02/22/2016 X-ray AP pelvis and right hip show previous hardware from ORIF. She does have mild-to-moderate hip DJD. Hardware is intact. Impression right hip DJD. Status post previous ORIF 20 years ago.    PMFS History: Patient Active Problem List   Diagnosis Date Noted  . Cognitive disorder 07/31/2012   Past Medical History:  Diagnosis  Date  . Allergy   . Anxiety   . Arthritis   . Depression   . GERD (gastroesophageal reflux disease)   . Hyperlipidemia   . Osteoporosis   . Thyroid disease     No family history on file.  No past surgical history on file. Social History   Occupational History  . Not on file.   Social History Main Topics  . Smoking status: Never Smoker  . Smokeless tobacco: Never Used  . Alcohol use No  . Drug use: No  . Sexual activity: Not on file

## 2016-03-06 DIAGNOSIS — D225 Melanocytic nevi of trunk: Secondary | ICD-10-CM | POA: Diagnosis not present

## 2016-03-06 DIAGNOSIS — L438 Other lichen planus: Secondary | ICD-10-CM | POA: Diagnosis not present

## 2016-03-06 DIAGNOSIS — D1801 Hemangioma of skin and subcutaneous tissue: Secondary | ICD-10-CM | POA: Diagnosis not present

## 2016-03-06 DIAGNOSIS — L821 Other seborrheic keratosis: Secondary | ICD-10-CM | POA: Diagnosis not present

## 2016-03-06 DIAGNOSIS — L814 Other melanin hyperpigmentation: Secondary | ICD-10-CM | POA: Diagnosis not present

## 2016-03-06 DIAGNOSIS — L281 Prurigo nodularis: Secondary | ICD-10-CM | POA: Diagnosis not present

## 2016-03-24 DIAGNOSIS — A6000 Herpesviral infection of urogenital system, unspecified: Secondary | ICD-10-CM

## 2016-03-24 HISTORY — DX: Herpesviral infection of urogenital system, unspecified: A60.00

## 2016-04-09 DIAGNOSIS — F09 Unspecified mental disorder due to known physiological condition: Secondary | ICD-10-CM | POA: Diagnosis not present

## 2016-04-09 DIAGNOSIS — F331 Major depressive disorder, recurrent, moderate: Secondary | ICD-10-CM | POA: Diagnosis not present

## 2016-04-29 DIAGNOSIS — F331 Major depressive disorder, recurrent, moderate: Secondary | ICD-10-CM | POA: Diagnosis not present

## 2016-05-12 ENCOUNTER — Ambulatory Visit (INDEPENDENT_AMBULATORY_CARE_PROVIDER_SITE_OTHER): Payer: PPO | Admitting: Orthopaedic Surgery

## 2016-05-12 ENCOUNTER — Encounter (INDEPENDENT_AMBULATORY_CARE_PROVIDER_SITE_OTHER): Payer: Self-pay | Admitting: Orthopaedic Surgery

## 2016-05-12 ENCOUNTER — Encounter (INDEPENDENT_AMBULATORY_CARE_PROVIDER_SITE_OTHER): Payer: Self-pay

## 2016-05-12 VITALS — BP 119/75 | HR 89 | Ht 65.0 in | Wt 151.0 lb

## 2016-05-12 DIAGNOSIS — M1811 Unilateral primary osteoarthritis of first carpometacarpal joint, right hand: Secondary | ICD-10-CM

## 2016-05-12 NOTE — Progress Notes (Signed)
Office Visit Note   Patient: Martha Harrell           Date of Birth: Oct 01, 1950           MRN: [id number] Visit Date: 05/12/2016              Requested by: Derrill Center, MD [address] [place], [place] [address] PCP: Derrill Center, MD   Assessment & Plan: Visit Diagnoses:  1. Osteoarthritis of right thumb   2.    Left calf discomfort without evidence of radiculopathy or myelopathy. Normal joint exam no evidence of peripheral nerve entrapment.  Plan: Observation treatment recommended. She can use a dorsal splint on her thumb to wrist IP joint right thumb if she has persistent symptoms she can return for cortisone injection. Otherwise office follow-up as needed.  Follow-Up Instructions: Return if symptoms worsen or fail to improve.   Orders:  No orders of the defined types were placed in this encounter.  No orders of the defined types were placed in this encounter.     Procedures: No procedures performed   Clinical Data: No additional findings.   Subjective: Chief Complaint  Patient presents with  . Left Leg - Numbness    Patient presents with complaint that she has woken up for the past 2 mornings with her lower left leg "feeling different" than her right leg. That is the only way that she can explain it. She denies any pain. She states that it is not numb. It just does not feel the same.     Review of Systems 14 point review of systems a updated. Positive for cognitive disorder. Her stress fracture second metatarsal. Right thumb IP joint osteoarthritis. Positive for anxiety disorder. Previous steroid epidural injection. Annular tear L4-5.   Objective: Vital Signs: BP 119/75   Pulse 89   Ht 5\' 5"  (1.651 m)   Wt 151 lb (68.5 kg)   BMI 25.13 kg/m   Physical Exam  Constitutional: She is oriented to person, place, and time. She appears well-developed.  HENT:  Head: Normocephalic.  Right Ear: External ear normal.  Left Ear: External ear normal.  Eyes:  Pupils are equal, round, and reactive to light.  Neck: No tracheal deviation present. No thyromegaly present.  Cardiovascular: Normal rate.   Pulmonary/Chest: Effort normal.  Abdominal: Soft.  Musculoskeletal:  Patient has negative straight leg raising 90. Healed right lateral incision from enchondroma removal and compression plate right hip. With slow motion her hip internal rotation is symmetrical. Reflexes are 2+ and symmetrical. No tenderness over the left fibular head. She has some pain with IP range of motion right thumb and palp osteophytes right thumb IV joint as well as IP joint left index, right small finger and left small finger. No PIP no MCP degenerative changes. Negative for triggering flexors and extensors are intact collateral ligaments are stable in the fingers. Good cervical range of motion. She can heel and toe walk easily. No palpable defect in the calf. Symmetrical Circumference. Peroneal posterior tibial burn normal she can heel and toe walk rapidly.  Neurological: She is alert and oriented to person, place, and time.  Skin: Skin is warm and dry.  Psychiatric: She has a normal mood and affect. Her behavior is normal.    Ortho Exam  Specialty Comments:  No specialty comments available.  Imaging: No results found.   PMFS History: Patient Active Problem List   Diagnosis Date Noted  . Cognitive disorder 07/31/2012   Past  Medical History:  Diagnosis Date  . Allergy   . Anxiety   . Arthritis   . Depression   . GERD (gastroesophageal reflux disease)   . Hyperlipidemia   . Osteoporosis   . Thyroid disease     No family history on file.  No past surgical history on file. Social History   Occupational History  . Not on file.   Social History Main Topics  . Smoking status: Never Smoker  . Smokeless tobacco: Never Used  . Alcohol use No  . Drug use: No  . Sexual activity: Not on file

## 2016-05-26 ENCOUNTER — Emergency Department (HOSPITAL_COMMUNITY): Payer: PPO

## 2016-05-26 ENCOUNTER — Encounter (HOSPITAL_COMMUNITY): Payer: Self-pay | Admitting: *Deleted

## 2016-05-26 ENCOUNTER — Emergency Department (HOSPITAL_COMMUNITY)
Admission: EM | Admit: 2016-05-26 | Discharge: 2016-05-26 | Disposition: A | Discharge status: Home / Self Care | Payer: PPO | Attending: Emergency Medicine | Admitting: Emergency Medicine

## 2016-05-26 DIAGNOSIS — R202 Paresthesia of skin: Secondary | ICD-10-CM | POA: Insufficient documentation

## 2016-05-26 DIAGNOSIS — Z7982 Long term (current) use of aspirin: Secondary | ICD-10-CM | POA: Insufficient documentation

## 2016-05-26 DIAGNOSIS — R0602 Shortness of breath: Secondary | ICD-10-CM | POA: Diagnosis not present

## 2016-05-26 DIAGNOSIS — N390 Urinary tract infection, site not specified: Secondary | ICD-10-CM | POA: Diagnosis not present

## 2016-05-26 DIAGNOSIS — Z79899 Other long term (current) drug therapy: Secondary | ICD-10-CM | POA: Insufficient documentation

## 2016-05-26 LAB — URINALYSIS, ROUTINE W REFLEX MICROSCOPIC
Bilirubin Urine: NEGATIVE
GLUCOSE, UA: NEGATIVE mg/dL
HGB URINE DIPSTICK: NEGATIVE
Ketones, ur: NEGATIVE mg/dL
NITRITE: POSITIVE — AB
PH: 6 (ref 5.0–8.0)
Protein, ur: NEGATIVE mg/dL
Specific Gravity, Urine: 1.006 (ref 1.005–1.030)

## 2016-05-26 LAB — BASIC METABOLIC PANEL
Anion gap: 11 (ref 5–15)
BUN: 14 mg/dL (ref 6–20)
CO2: 22 mmol/L (ref 22–32)
Calcium: 9.8 mg/dL (ref 8.9–10.3)
Chloride: 106 mmol/L (ref 101–111)
Creatinine, Ser: 0.95 mg/dL (ref 0.44–1.00)
GFR calc Af Amer: >60 mL/min (ref >60)
GFR calc non Af Amer: >60 mL/min (ref >60)
GLUCOSE: 90 mg/dL (ref 65–99)
POTASSIUM: 3.8 mmol/L (ref 3.5–5.1)
SODIUM: 139 mmol/L (ref 135–145)

## 2016-05-26 LAB — CBC
HEMATOCRIT: 39 % (ref 36.0–46.0)
Hemoglobin: 13.1 g/dL (ref 12.0–15.0)
MCH: 29.9 pg (ref 26.0–34.0)
MCHC: 33.6 g/dL (ref 30.0–36.0)
MCV: 89 fL (ref 78.0–100.0)
Platelets: 206 10*3/uL (ref 150–400)
RBC: 4.38 MIL/uL (ref 3.87–5.11)
RDW: 13.3 % (ref 11.5–15.5)
WBC: 6.6 10*3/uL (ref 4.0–10.5)

## 2016-05-26 LAB — I-STAT TROPONIN, ED
TROPONIN I, POC: 0 ng/mL (ref 0.00–0.08)
Troponin i, poc: 0 ng/mL (ref 0.00–0.08)

## 2016-05-26 MED ORDER — CEPHALEXIN 500 MG PO CAPS: 500.0000 mg | ORAL_CAPSULE | Freq: Two times a day (BID) | ORAL | 0 refills | Status: DC

## 2016-05-26 NOTE — ED Notes (Signed)
Denies fever, chills, coughing,  nausea, vomiting, or diarrhea. Hx of anxiety.  VSS.

## 2016-05-26 NOTE — Discharge Instructions (Signed)
Your work-up suggests that you may have UTI. Please take antibiotics as prescribed.  The rest of your work-up is reassuring.  Please return without fail for worsening symptoms, including confusion, numbness or weakness, difficulty breathing, passing out, or any other symptoms concerning to you.

## 2016-05-26 NOTE — ED Notes (Signed)
Pt updated on plan of care.  Appears in no distress.

## 2016-05-26 NOTE — ED Provider Notes (Signed)
[facility] DEPT Provider Note    By signing my name below, I, Martha Harrell, attest that this documentation has been prepared under the direction and in the presence of Martha Dandy, MD. Electronically Signed: Bea Harrell, ED Scribe. 05/26/16. 3:34 PM.    History   Chief Complaint Chief Complaint  Patient presents with  . Shortness of Breath  . Tingling   The history is provided by the patient and medical records. No language interpreter was used.    Martha Harrell is a 66 y.o. female with PMHx of anxiety and depression, HLD, and thyroid disease who presents to the Emergency Department complaining of sudden onset tingling that began in bilateral fingers and spread up and down body that started a few hours ago while she was sitting at her computer. She states the tingling went to her entire body, including face to feet. Pt states the tingling lasted until after arriving here at the hospital and reports her symptoms have now resolved. Pt reports her only son and granddaughter recently moved to [place] and states she has been seeing a therapist because it is very upsetting to her. She has not taken anything for treatment of her symptoms. There are no modifying factors noted. She denies SOB, CP, numbness or weakness, changes in speech, fever, chills, nausea, vomiting, cough, congestion, rhinorrhea, dizziness, light headedness, urinary complaints or pain of any kind. She states she has been eating and drinking normally. Pt reports having a stomach virus last week.   Past Medical History:  Diagnosis Date  . Allergy   . Anxiety   . Arthritis   . Depression   . GERD (gastroesophageal reflux disease)   . Hyperlipidemia   . Osteoporosis   . Thyroid disease     Patient Active Problem List   Diagnosis Date Noted  . Cognitive disorder 07/31/2012    Past Surgical History:  Procedure Laterality Date  . BACK SURGERY    . FOOT SURGERY Right    rod to R leg per tumor removal      OB History    No data available       Home Medications    Prior to Admission medications   Medication Sig Start Date End Date Taking? Authorizing Provider  acyclovir (ZOVIRAX) 200 MG capsule Take 200 mg by mouth 5 (five) times daily.    Historical Provider, MD  acyclovir (ZOVIRAX) 200 MG capsule TAKE ONE CAPSULE BY MOUTH TWICE A DAY 06/01/15   Historical Provider, MD  Ascorbic Acid (VITAMIN C) 1000 MG tablet Take 1,000 mg by mouth. 06/18/09   Historical Provider, MD  aspirin 81 MG tablet Take 81 mg by mouth daily.    Historical Provider, MD  aspirin EC 81 MG tablet Take 81 mg by mouth. 06/14/09   Historical Provider, MD  CALCIUM PO Take 250 mg by mouth.    Historical Provider, MD  carisoprodol (SOMA) 350 MG tablet Take 350 mg by mouth 4 (four) times daily as needed for muscle spasms.    Historical Provider, MD  carisoprodol (SOMA) 350 MG tablet Take 350 mg by mouth. 06/18/09   Historical Provider, MD  celecoxib (CELEBREX) 200 MG capsule Take 200 mg by mouth. 05/13/12   Historical Provider, MD  cephALEXin (KEFLEX) 500 MG capsule Take 1 capsule (500 mg total) by mouth 2 (two) times daily. 05/26/16   Martha Dandy, MD  cetirizine (ZYRTEC) 10 MG tablet Take 10 mg by mouth.    Historical Provider, MD  Cholecalciferol (VITAMIN  D3) 2000 units capsule Take by mouth. 06/18/09   Historical Provider, MD  citalopram (CELEXA) 40 MG tablet Take 40 mg by mouth daily.    Historical Provider, MD  citalopram (CELEXA) 40 MG tablet Take 40 mg by mouth.    Historical Provider, MD  clonazePAM Bobbye Charleston) 1 MG tablet  11/26/15   Historical Provider, MD  DULoxetine (CYMBALTA) 60 MG capsule  12/01/15   Historical Provider, MD  estradiol (ESTRACE) 0.1 MG/GM vaginal cream Place 1 Applicatorful vaginally at bedtime.    Historical Provider, MD  estradiol (ESTRACE) 0.1 MG/GM vaginal cream Place vaginally.    Historical Provider, MD  fluconazole (DIFLUCAN) 150 MG tablet TAKE 1 TABLET (150 MG TOTAL) BY MOUTH ONCE. Patient not  taking: Reported on 05/12/2016 04/21/13   [name], DPM  fluconazole (DIFLUCAN) 150 MG tablet Take 1 tablet (150 mg total) by mouth once. Patient not taking: Reported on 05/12/2016 04/21/13   [name], DPM  fluticasone (FLONASE) 50 MCG/ACT nasal spray USE 2 SPRAYS IN EACH NOSTRIL ONCE DAILY 10/03/15   Historical Provider, MD  fluticasone (VERAMYST) 27.5 MCG/SPRAY nasal spray Place 2 sprays into the nose daily. 27.5 cg/spray/lc    Historical Provider, MD  FLUZONE HIGH-DOSE 0.5 ML SUSY  10/09/15   Historical Provider, MD  gabapentin (NEURONTIN) 400 MG capsule Take 400 mg by mouth 3 (three) times daily.    Historical Provider, MD  gabapentin (NEURONTIN) 400 MG capsule TAKE ONE CAPSULE BY MOUTH EVERY MORNING, 1 IN THE AFTERNOON, AND 4 AT BEDTIME 09/04/15   Historical Provider, MD  HYDROcodone-acetaminophen (NORCO/VICODIN) 5-325 MG per tablet Take 1 tablet by mouth every 6 (six) hours as needed for moderate pain.    Historical Provider, MD  hyoscyamine (LEVSIN) 0.125 MG/5ML ELIX Take 0.125 mg by mouth.    Historical Provider, MD  lidocaine (LIDODERM) 5 % Place 1 patch onto the skin daily. Remove & Discard patch within 12 hours or as directed by MD    Historical Provider, MD  lidocaine (LIDODERM) 5 % Place onto the skin.    Historical Provider, MD  Meth-Hyo-M Bl-Na Phos-Ph Sal (URIBEL) 118 MG CAPS Take by mouth.    Historical Provider, MD  pantoprazole (PROTONIX) 40 MG tablet Take 40 mg by mouth daily.    Historical Provider, MD  pantoprazole (PROTONIX) 40 MG tablet Take 40 mg by mouth.    Historical Provider, MD  PREVNAR 13 SUSP injection  11/06/15   Historical Provider, MD  promethazine (PHENERGAN) 12.5 MG tablet Take 12.5 mg by mouth every 6 (six) hours as needed for nausea or vomiting.    Historical Provider, MD  promethazine (PHENERGAN) 12.5 MG tablet Take 12.5 mg by mouth.    Historical Provider, MD  simvastatin (ZOCOR) 40 MG tablet  12/10/15   Historical Provider, MD    Family History No  family history on file.  Social History Social History  Substance Use Topics  . Smoking status: Never Smoker  . Smokeless tobacco: Never Used  . Alcohol use No     Allergies   Ciprocinonide [fluocinolone]; Nortriptyline; and Septra [sulfamethoxazole-trimethoprim]   Review of Systems Review of Systems 10/14 systems reviewed and are negative other than those stated in the HPI   Physical Exam Updated Vital Signs BP 124/61   Pulse 77   Temp 98.2 F (36.8 C) (Oral)   Resp 17   Ht 5\' 5"  (1.651 m)   Wt 151 lb (68.5 kg)   SpO2 99%   BMI 25.13 kg/m  Physical Exam Physical Exam  Nursing note and vitals reviewed. Constitutional: Well developed, well nourished, non-toxic, and in no acute distress Head: Normocephalic and atraumatic.  Mouth/Throat: Oropharynx is clear and moist.  Neck: Normal range of motion. Neck supple.  Cardiovascular: Normal rate and regular rhythm.   Pulmonary/Chest: Effort normal and breath sounds normal.  Abdominal: Soft. There is no tenderness. There is no rebound and no guarding.  Musculoskeletal: Normal range of motion.  Skin: Skin is warm and dry.  Psychiatric: Cooperative Neurological:  Alert, oriented to person, place, time, and situation. Memory grossly in tact. Fluent speech. No dysarthria or aphasia.  Cranial nerves: Pupils are symmetric, and reactive to light. EOMI without nystagmus. No gaze deviation. Facial muscles symmetric with activation. Sensation to light touch over face in tact bilaterally. Hearing grossly in tact. Palate elevates symmetrically. Head turn and shoulder shrug are intact. Tongue midline.  Reflexes defered.  Muscle bulk and tone normal. No pronator drift. Moves all extremities symmetrically. Sensation to light touch is in tact throughout in bilateral upper and lower extremities. Coordination reveals no dysmetria with finger to nose. Gait is narrow-based and steady. Non-ataxic.   ED Treatments / Results  DIAGNOSTIC  STUDIES: Oxygen Saturation is 99% on RA, normal by my interpretation.   COORDINATION OF CARE: 3:08 PM- Informed pt that her resulted labs are WNL. Reassured pt that her physical exam is normal. Pt verbalizes understanding and agrees to plan.  Medications - No data to display  Labs (all labs ordered are listed, but only abnormal results are displayed) Labs Reviewed  URINALYSIS, ROUTINE W REFLEX MICROSCOPIC - Abnormal; Notable for the following:       Result Value   Color, Urine AMBER (*)    Nitrite POSITIVE (*)    Leukocytes, UA TRACE (*)    Bacteria, UA RARE (*)    Squamous Epithelial / LPF 0-5 (*)    Non Squamous Epithelial 0-5 (*)    All other components within normal limits  URINE CULTURE  BASIC METABOLIC PANEL  CBC  I-STAT TROPOININ, ED  I-STAT TROPOININ, ED    EKG  EKG Interpretation  Date/Time:  Monday May 26 2016 11:50:48 EDT Ventricular Rate:  77 PR Interval:  154 QRS Duration: 78 QT Interval:  416 QTC Calculation: 470 R Axis:   46 Text Interpretation:  Normal sinus rhythm Low voltage QRS Nonspecific ST abnormality Abnormal ECG no acute changes  Confirmed by LIU MD, DANA [phone]) on 05/26/2016 3:05:26 PM       Radiology Dg Chest 2 View  Result Date: 05/26/2016 CLINICAL DATA:  Shortness of breath. EXAM: CHEST  2 VIEW COMPARISON:  Prior report 01/15/2009. FINDINGS: Mediastinum hilar structures normal. Low lung volumes with mild basilar atelectasis. Mild bibasilar infiltrates cannot be excluded. No prominent pleural effusion. No pneumothorax. Heart size normal. No acute bony abnormality P IMPRESSION: Lung volumes with mild bibasilar atelectasis. Mild bibasilar infiltrates cannot be excluded . Electronically Signed   By: Marcello Moores  Register   On: 05/26/2016 12:20    Procedures Procedures (including critical care time)  Medications Ordered in ED Medications - No data to display   Initial Impression / Assessment and Plan / ED Course  I have reviewed the triage  vital signs and the nursing notes.  Pertinent labs & imaging results that were available during my care of the patient were reviewed by me and considered in my medical decision making (see chart for details).     Presents with episode of tingling all over body,  now resolved. Nursing triage note suggests that she became short of breath and anxious with this, but she states this did not happen. Does not appear she was hyperventilating or short of breath while this was happening. Does endorse being really stress recently due to her son moving away, but unsure if this may have contributed.   She is well appearing. She has normal neuro exam. Presentation does not seem consistent with that of CVA or TIA, given diffuse nature of tingling. Did not feel she was syncopal/near syncopal with this. Atypical for a cardiac presentation as well. EKG non-ischemic and serial troponins normal. She has no electrolyte or metabolic derangements. CXR visualized and without acute cardiopulmonary processes. She has evidence of UTI on her UA, and will treat with course of keflex.   The patient appears reasonably screened and/or stabilized for discharge and I doubt any other medical condition or other [facility] requiring further screening, evaluation, or treatment in the ED at this time prior to discharge.  Strict return and follow-up instructions reviewed. She expressed understanding of all discharge instructions and felt comfortable with the plan of care.   Final Clinical Impressions(s) / ED Diagnoses   Final diagnoses:  Tingling  Acute lower UTI    New Prescriptions New Prescriptions   CEPHALEXIN (KEFLEX) 500 MG CAPSULE    Take 1 capsule (500 mg total) by mouth 2 (two) times daily.    I personally performed the services described in this documentation, which was scribed in my presence. The recorded information has been reviewed and is accurate.     Martha Dandy, MD 05/26/16 [phone]

## 2016-05-26 NOTE — ED Triage Notes (Signed)
PT states she was sitting at the computer and began experiencing acute onset sob and tingling in all limbs.  Denies chest pain or abdominal pain.  States has been really stressed out b/c her only son and his family moved to [place] and she just saw a picture of them at [place] on [address].  Crying in triage.

## 2016-05-27 LAB — URINE CULTURE: Culture: <10000 — AB

## 2016-06-25 DIAGNOSIS — M25572 Pain in left ankle and joints of left foot: Secondary | ICD-10-CM | POA: Diagnosis not present

## 2016-07-16 DIAGNOSIS — M25512 Pain in left shoulder: Secondary | ICD-10-CM | POA: Diagnosis not present

## 2016-07-18 ENCOUNTER — Ambulatory Visit (INDEPENDENT_AMBULATORY_CARE_PROVIDER_SITE_OTHER): Payer: PPO | Admitting: Orthopaedic Surgery

## 2016-07-24 DIAGNOSIS — A084 Viral intestinal infection, unspecified: Secondary | ICD-10-CM | POA: Diagnosis not present

## 2016-08-01 DIAGNOSIS — R55 Syncope and collapse: Secondary | ICD-10-CM | POA: Diagnosis not present

## 2016-08-08 DIAGNOSIS — R1033 Periumbilical pain: Secondary | ICD-10-CM | POA: Diagnosis not present

## 2016-08-08 DIAGNOSIS — R109 Unspecified abdominal pain: Secondary | ICD-10-CM | POA: Diagnosis not present

## 2016-08-23 ENCOUNTER — Encounter (HOSPITAL_COMMUNITY): Payer: Self-pay | Admitting: Emergency Medicine

## 2016-08-23 ENCOUNTER — Emergency Department (HOSPITAL_COMMUNITY)
Admission: EM | Admit: 2016-08-23 | Discharge: 2016-08-23 | Disposition: A | Discharge status: Home / Self Care | Payer: PPO | Attending: Emergency Medicine | Admitting: Emergency Medicine

## 2016-08-23 DIAGNOSIS — R509 Fever, unspecified: Secondary | ICD-10-CM | POA: Diagnosis not present

## 2016-08-23 DIAGNOSIS — Z5321 Procedure and treatment not carried out due to patient leaving prior to being seen by health care provider: Secondary | ICD-10-CM | POA: Insufficient documentation

## 2016-08-23 LAB — URINALYSIS, ROUTINE W REFLEX MICROSCOPIC
Bilirubin Urine: NEGATIVE
Glucose, UA: NEGATIVE mg/dL
Hgb urine dipstick: NEGATIVE
Ketones, ur: NEGATIVE mg/dL
Leukocytes, UA: NEGATIVE
Nitrite: NEGATIVE
Protein, ur: NEGATIVE mg/dL
Specific Gravity, Urine: 1.018 (ref 1.005–1.030)
pH: 5 (ref 5.0–8.0)

## 2016-08-23 LAB — COMPREHENSIVE METABOLIC PANEL
ALT: 20 U/L (ref 14–54)
AST: 22 U/L (ref 15–41)
Albumin: 4.2 g/dL (ref 3.5–5.0)
Alkaline Phosphatase: 55 U/L (ref 38–126)
Anion gap: 10 (ref 5–15)
BUN: 17 mg/dL (ref 6–20)
CO2: 24 mmol/L (ref 22–32)
Calcium: 9 mg/dL (ref 8.9–10.3)
Chloride: 106 mmol/L (ref 101–111)
Creatinine, Ser: 0.97 mg/dL (ref 0.44–1.00)
GFR calc Af Amer: >60 mL/min (ref >60)
GFR calc non Af Amer: 59 mL/min — ABNORMAL LOW (ref >60)
Glucose, Bld: 137 mg/dL — ABNORMAL HIGH (ref 65–99)
Potassium: 3.6 mmol/L (ref 3.5–5.1)
Sodium: 140 mmol/L (ref 135–145)
Total Bilirubin: 0.9 mg/dL (ref 0.3–1.2)
Total Protein: 6.8 g/dL (ref 6.5–8.1)

## 2016-08-23 LAB — CBC
HCT: 39.7 % (ref 36.0–46.0)
Hemoglobin: 13 g/dL (ref 12.0–15.0)
MCH: 30.1 pg (ref 26.0–34.0)
MCHC: 32.7 g/dL (ref 30.0–36.0)
MCV: 91.9 fL (ref 78.0–100.0)
Platelets: 165 10*3/uL (ref 150–400)
RBC: 4.32 MIL/uL (ref 3.87–5.11)
RDW: 13.9 % (ref 11.5–15.5)
WBC: 7.7 10*3/uL (ref 4.0–10.5)

## 2016-08-23 LAB — LIPASE, BLOOD: Lipase: 25 U/L (ref 11–51)

## 2016-08-23 NOTE — ED Notes (Signed)
Pt not in lobby, has been called multiple times.

## 2016-08-23 NOTE — ED Notes (Signed)
Patient did not answer x 2 

## 2016-08-23 NOTE — ED Notes (Signed)
Pt did not answer x 1 

## 2016-08-23 NOTE — ED Triage Notes (Signed)
Pt to ED via [facility]> c/o fever, chills, and 1 episode of diarrhea.  Symptoms started 30 min ago.  Did not take temperature at home.

## 2016-08-25 DIAGNOSIS — M810 Age-related osteoporosis without current pathological fracture: Secondary | ICD-10-CM | POA: Diagnosis not present

## 2016-08-28 DIAGNOSIS — G8929 Other chronic pain: Secondary | ICD-10-CM | POA: Diagnosis not present

## 2016-08-28 DIAGNOSIS — F411 Generalized anxiety disorder: Secondary | ICD-10-CM | POA: Diagnosis not present

## 2016-08-28 DIAGNOSIS — M545 Low back pain: Secondary | ICD-10-CM | POA: Diagnosis not present

## 2016-08-28 DIAGNOSIS — M5137 Other intervertebral disc degeneration, lumbosacral region: Secondary | ICD-10-CM | POA: Diagnosis not present

## 2016-08-28 DIAGNOSIS — M533 Sacrococcygeal disorders, not elsewhere classified: Secondary | ICD-10-CM | POA: Diagnosis not present

## 2016-08-28 DIAGNOSIS — F329 Major depressive disorder, single episode, unspecified: Secondary | ICD-10-CM | POA: Diagnosis not present

## 2016-08-28 DIAGNOSIS — G894 Chronic pain syndrome: Secondary | ICD-10-CM | POA: Diagnosis not present

## 2016-09-01 DIAGNOSIS — Z23 Encounter for immunization: Secondary | ICD-10-CM | POA: Diagnosis not present

## 2016-09-01 DIAGNOSIS — S61012A Laceration without foreign body of left thumb without damage to nail, initial encounter: Secondary | ICD-10-CM | POA: Diagnosis not present

## 2016-09-01 DIAGNOSIS — T1490XA Injury, unspecified, initial encounter: Secondary | ICD-10-CM | POA: Diagnosis not present

## 2016-09-01 DIAGNOSIS — S0990XA Unspecified injury of head, initial encounter: Secondary | ICD-10-CM | POA: Diagnosis not present

## 2016-09-01 DIAGNOSIS — W19XXXA Unspecified fall, initial encounter: Secondary | ICD-10-CM | POA: Diagnosis not present

## 2016-09-10 DIAGNOSIS — R350 Frequency of micturition: Secondary | ICD-10-CM | POA: Diagnosis not present

## 2016-09-11 DIAGNOSIS — W100XXD Fall (on)(from) escalator, subsequent encounter: Secondary | ICD-10-CM | POA: Diagnosis not present

## 2016-09-11 DIAGNOSIS — S99912A Unspecified injury of left ankle, initial encounter: Secondary | ICD-10-CM | POA: Diagnosis not present

## 2016-09-11 DIAGNOSIS — M25572 Pain in left ankle and joints of left foot: Secondary | ICD-10-CM | POA: Diagnosis not present

## 2016-09-11 DIAGNOSIS — S9002XD Contusion of left ankle, subsequent encounter: Secondary | ICD-10-CM | POA: Diagnosis not present

## 2016-09-11 DIAGNOSIS — S61012D Laceration without foreign body of left thumb without damage to nail, subsequent encounter: Secondary | ICD-10-CM | POA: Diagnosis not present

## 2016-09-19 DIAGNOSIS — R413 Other amnesia: Secondary | ICD-10-CM | POA: Diagnosis not present

## 2016-09-19 DIAGNOSIS — Z820 Family history of epilepsy and other diseases of the nervous system: Secondary | ICD-10-CM | POA: Diagnosis not present

## 2016-10-01 DIAGNOSIS — M19072 Primary osteoarthritis, left ankle and foot: Secondary | ICD-10-CM | POA: Diagnosis not present

## 2016-10-25 DIAGNOSIS — A6000 Herpesviral infection of urogenital system, unspecified: Secondary | ICD-10-CM | POA: Diagnosis not present

## 2016-11-04 DIAGNOSIS — Z0000 Encounter for general adult medical examination without abnormal findings: Secondary | ICD-10-CM | POA: Diagnosis not present

## 2016-11-04 DIAGNOSIS — E785 Hyperlipidemia, unspecified: Secondary | ICD-10-CM | POA: Diagnosis not present

## 2016-11-07 DIAGNOSIS — M810 Age-related osteoporosis without current pathological fracture: Secondary | ICD-10-CM | POA: Diagnosis not present

## 2016-11-07 DIAGNOSIS — F411 Generalized anxiety disorder: Secondary | ICD-10-CM | POA: Diagnosis not present

## 2016-11-07 DIAGNOSIS — R7302 Impaired glucose tolerance (oral): Secondary | ICD-10-CM | POA: Diagnosis not present

## 2016-11-07 DIAGNOSIS — Z0000 Encounter for general adult medical examination without abnormal findings: Secondary | ICD-10-CM | POA: Diagnosis not present

## 2016-11-07 DIAGNOSIS — E785 Hyperlipidemia, unspecified: Secondary | ICD-10-CM | POA: Diagnosis not present

## 2016-11-07 DIAGNOSIS — I6523 Occlusion and stenosis of bilateral carotid arteries: Secondary | ICD-10-CM | POA: Diagnosis not present

## 2016-11-07 DIAGNOSIS — K3184 Gastroparesis: Secondary | ICD-10-CM

## 2016-11-07 DIAGNOSIS — F3341 Major depressive disorder, recurrent, in partial remission: Secondary | ICD-10-CM | POA: Diagnosis not present

## 2016-11-07 HISTORY — DX: Gastroparesis: K31.84

## 2016-11-11 DIAGNOSIS — Z0000 Encounter for general adult medical examination without abnormal findings: Secondary | ICD-10-CM | POA: Diagnosis not present

## 2016-11-11 DIAGNOSIS — Z1231 Encounter for screening mammogram for malignant neoplasm of breast: Secondary | ICD-10-CM | POA: Diagnosis not present

## 2016-11-11 DIAGNOSIS — I6523 Occlusion and stenosis of bilateral carotid arteries: Secondary | ICD-10-CM | POA: Diagnosis not present

## 2016-11-27 DIAGNOSIS — G309 Alzheimer's disease, unspecified: Secondary | ICD-10-CM | POA: Diagnosis not present

## 2016-11-27 DIAGNOSIS — G9340 Encephalopathy, unspecified: Secondary | ICD-10-CM | POA: Diagnosis not present

## 2016-11-27 DIAGNOSIS — F411 Generalized anxiety disorder: Secondary | ICD-10-CM | POA: Diagnosis not present

## 2016-11-27 DIAGNOSIS — F0280 Dementia in other diseases classified elsewhere without behavioral disturbance: Secondary | ICD-10-CM

## 2016-11-27 HISTORY — DX: Alzheimer's disease, unspecified: G30.9

## 2016-11-27 HISTORY — DX: Dementia in other diseases classified elsewhere, unspecified severity, without behavioral disturbance, psychotic disturbance, mood disturbance, and anxiety: F02.80

## 2016-12-17 DIAGNOSIS — F331 Major depressive disorder, recurrent, moderate: Secondary | ICD-10-CM | POA: Diagnosis not present

## 2016-12-17 DIAGNOSIS — H524 Presbyopia: Secondary | ICD-10-CM | POA: Diagnosis not present

## 2016-12-17 DIAGNOSIS — F09 Unspecified mental disorder due to known physiological condition: Secondary | ICD-10-CM | POA: Diagnosis not present

## 2016-12-17 DIAGNOSIS — H5210 Myopia, unspecified eye: Secondary | ICD-10-CM | POA: Diagnosis not present

## 2016-12-17 DIAGNOSIS — H52229 Regular astigmatism, unspecified eye: Secondary | ICD-10-CM | POA: Diagnosis not present

## 2016-12-17 DIAGNOSIS — F411 Generalized anxiety disorder: Secondary | ICD-10-CM | POA: Diagnosis not present

## 2016-12-17 DIAGNOSIS — H5200 Hypermetropia, unspecified eye: Secondary | ICD-10-CM | POA: Diagnosis not present

## 2016-12-17 DIAGNOSIS — H3530 Unspecified macular degeneration: Secondary | ICD-10-CM | POA: Diagnosis not present

## 2016-12-26 DIAGNOSIS — D2261 Melanocytic nevi of right upper limb, including shoulder: Secondary | ICD-10-CM | POA: Diagnosis not present

## 2016-12-26 DIAGNOSIS — L814 Other melanin hyperpigmentation: Secondary | ICD-10-CM | POA: Diagnosis not present

## 2016-12-26 DIAGNOSIS — D485 Neoplasm of uncertain behavior of skin: Secondary | ICD-10-CM | POA: Diagnosis not present

## 2016-12-26 DIAGNOSIS — L821 Other seborrheic keratosis: Secondary | ICD-10-CM | POA: Diagnosis not present

## 2016-12-26 DIAGNOSIS — D1801 Hemangioma of skin and subcutaneous tissue: Secondary | ICD-10-CM | POA: Diagnosis not present

## 2016-12-26 DIAGNOSIS — D225 Melanocytic nevi of trunk: Secondary | ICD-10-CM | POA: Diagnosis not present

## 2016-12-26 DIAGNOSIS — D2262 Melanocytic nevi of left upper limb, including shoulder: Secondary | ICD-10-CM | POA: Diagnosis not present

## 2016-12-26 DIAGNOSIS — L570 Actinic keratosis: Secondary | ICD-10-CM | POA: Diagnosis not present

## 2016-12-31 DIAGNOSIS — G9340 Encephalopathy, unspecified: Secondary | ICD-10-CM | POA: Diagnosis not present

## 2016-12-31 DIAGNOSIS — Z79899 Other long term (current) drug therapy: Secondary | ICD-10-CM | POA: Diagnosis not present

## 2017-01-12 DIAGNOSIS — F411 Generalized anxiety disorder: Secondary | ICD-10-CM | POA: Diagnosis not present

## 2017-01-12 DIAGNOSIS — F09 Unspecified mental disorder due to known physiological condition: Secondary | ICD-10-CM | POA: Diagnosis not present

## 2017-01-12 DIAGNOSIS — F331 Major depressive disorder, recurrent, moderate: Secondary | ICD-10-CM | POA: Diagnosis not present

## 2017-02-05 DIAGNOSIS — F331 Major depressive disorder, recurrent, moderate: Secondary | ICD-10-CM | POA: Diagnosis not present

## 2017-02-05 DIAGNOSIS — F411 Generalized anxiety disorder: Secondary | ICD-10-CM | POA: Diagnosis not present

## 2017-02-05 DIAGNOSIS — F09 Unspecified mental disorder due to known physiological condition: Secondary | ICD-10-CM | POA: Diagnosis not present

## 2017-02-09 DIAGNOSIS — R7302 Impaired glucose tolerance (oral): Secondary | ICD-10-CM | POA: Diagnosis not present

## 2017-02-09 DIAGNOSIS — R5383 Other fatigue: Secondary | ICD-10-CM | POA: Diagnosis not present

## 2017-02-09 DIAGNOSIS — R197 Diarrhea, unspecified: Secondary | ICD-10-CM | POA: Diagnosis not present

## 2017-02-23 DIAGNOSIS — M25561 Pain in right knee: Secondary | ICD-10-CM | POA: Diagnosis not present

## 2017-02-25 DIAGNOSIS — R7301 Impaired fasting glucose: Secondary | ICD-10-CM

## 2017-02-25 HISTORY — DX: Impaired fasting glucose: R73.01

## 2017-03-02 DIAGNOSIS — F329 Major depressive disorder, single episode, unspecified: Secondary | ICD-10-CM | POA: Diagnosis not present

## 2017-03-02 DIAGNOSIS — F09 Unspecified mental disorder due to known physiological condition: Secondary | ICD-10-CM | POA: Diagnosis not present

## 2017-03-02 DIAGNOSIS — F411 Generalized anxiety disorder: Secondary | ICD-10-CM | POA: Diagnosis not present

## 2017-03-05 IMAGING — MR MR FOOT*L* W/O CM
5 of 6 series · 30 of 40 positions shown · non-contrast
Comparison: Plain films of the left foot performed at [REDACTED] 05/16/2015.

CLINICAL DATA: Left forefoot pain and swelling for 3 months. No
known injury.

EXAM:
MRI OF THE LEFT FOOT WITHOUT CONTRAST
TECHNIQUE: Multiplanar, multisequence MR imaging was performed. No intravenous
contrast was administered.

[Series 4: T1 · coronal · left · 3.0mm · 0.27mm/px · 9 of 47 slices shown (1 of 2)]
[im 1/47]
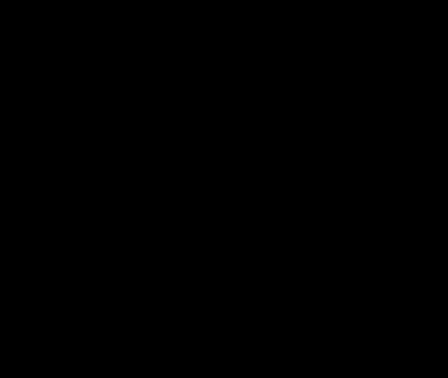
[im 6/47]
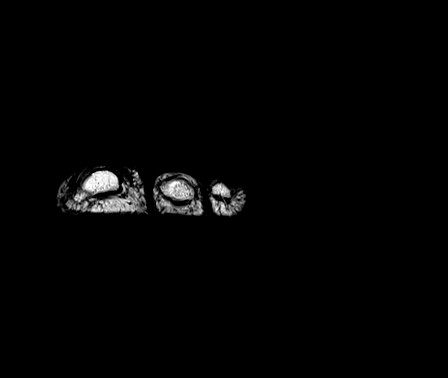
[im 12/47]
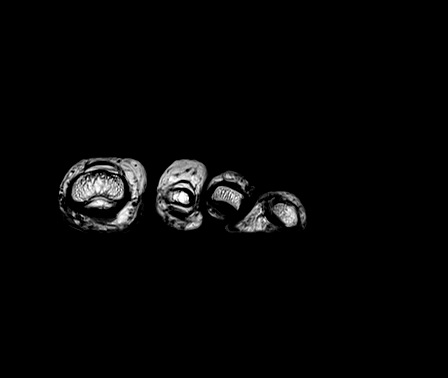
[im 18/47]
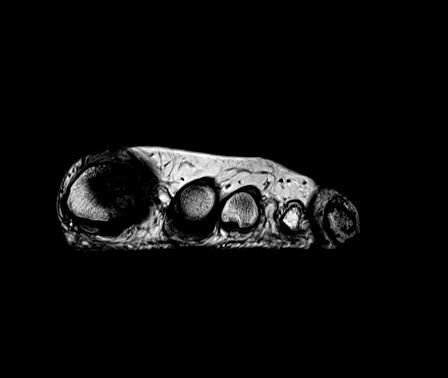
[im 24/47]
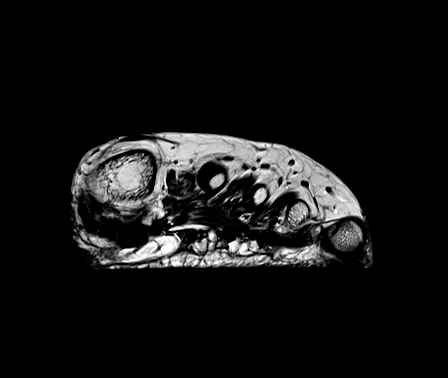
[im 29/47]
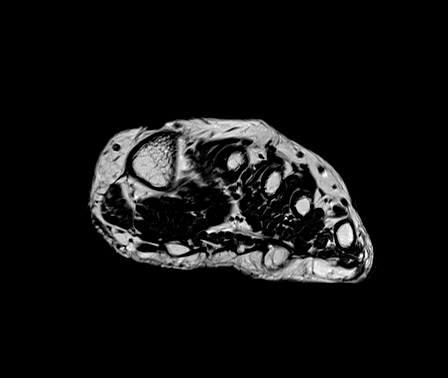
[im 35/47]
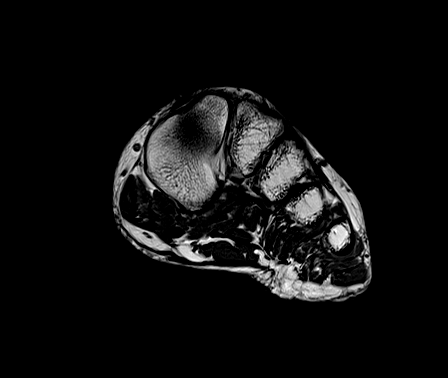
[im 41/47]
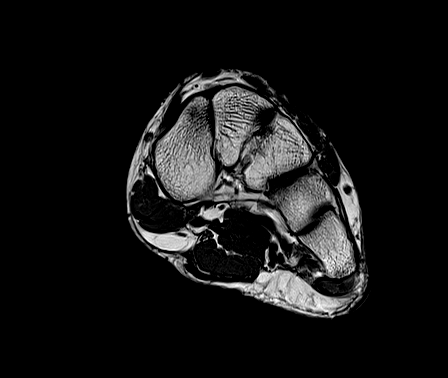
[im 47/47]
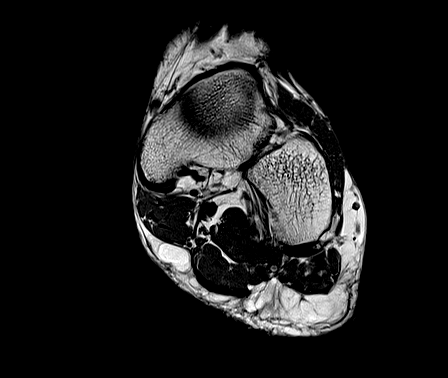

[Series 5: T2 fat-sat · coronal · left · 3.0mm · 0.27mm/px · 9 of 47 slices shown (1 of 3)]
[im 1/47]
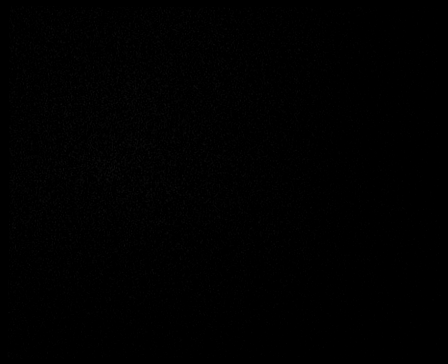
[im 6/47]
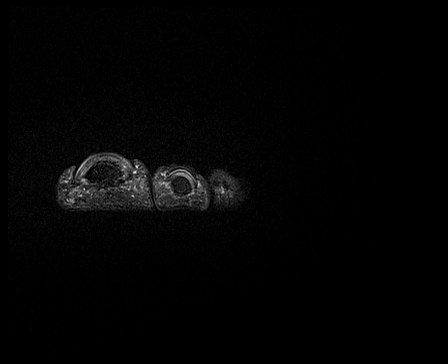
[im 12/47]
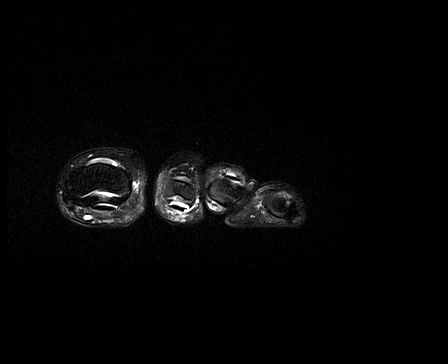
[im 18/47]
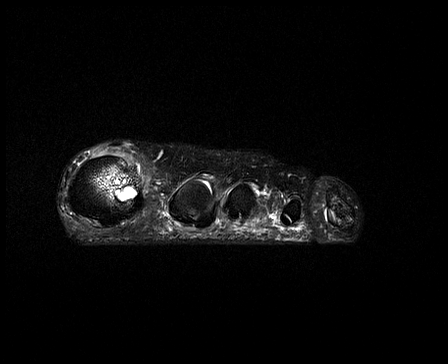
[im 24/47]
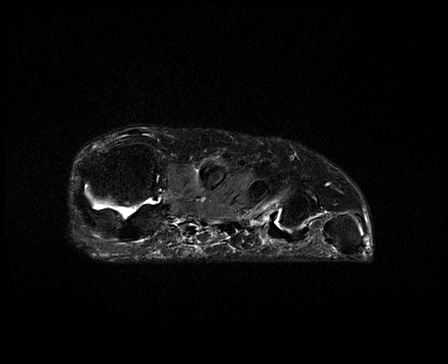
[im 29/47]
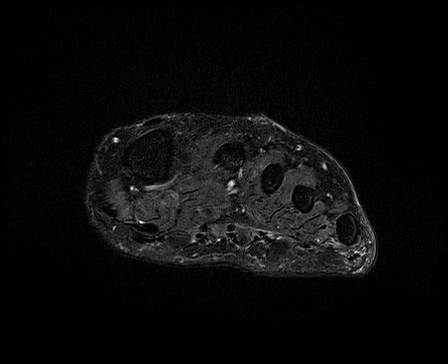
[im 35/47]
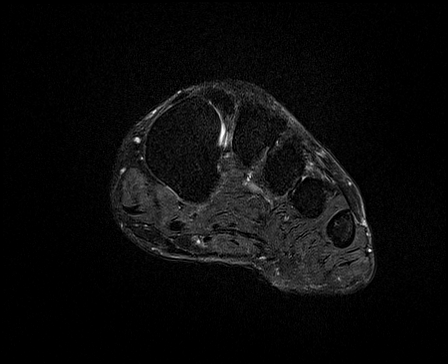
[im 41/47]
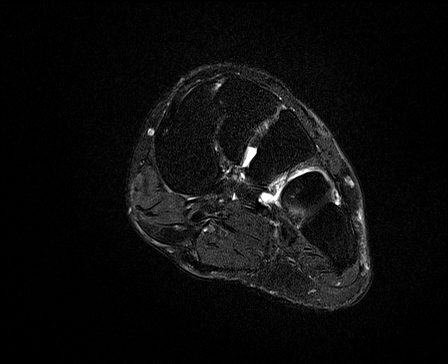
[im 47/47]
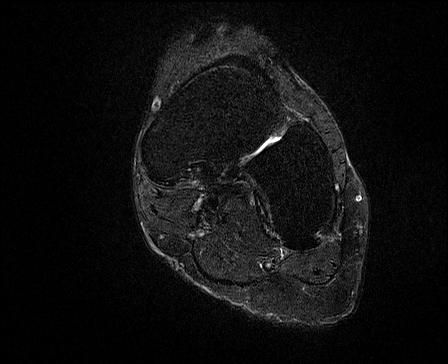

[Series 6: T2 fat-sat · sagittal · left · 3.0mm · 0.42mm/px · 6 of 28 slices shown (2 of 3)]
[im 1/28]
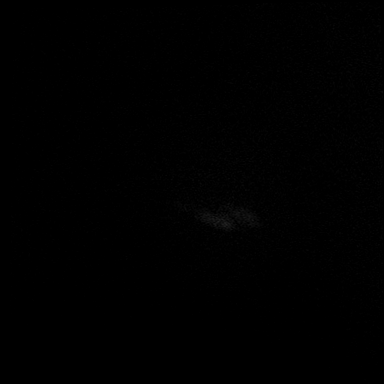
[im 6/28]
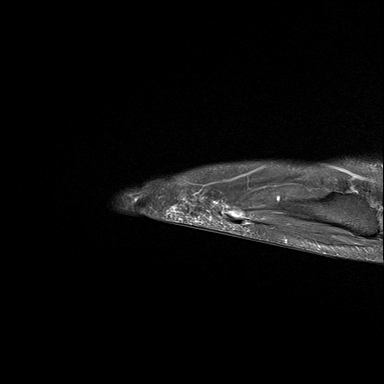
[im 11/28]
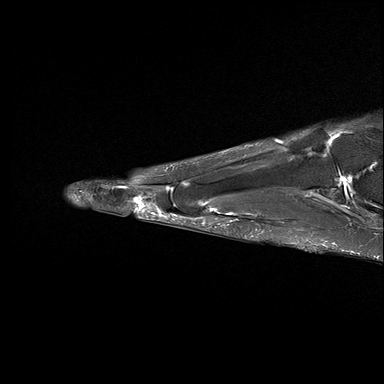
[im 17/28]
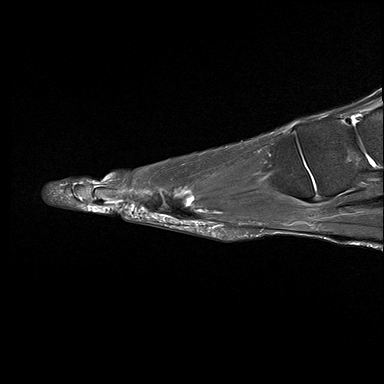
[im 22/28]
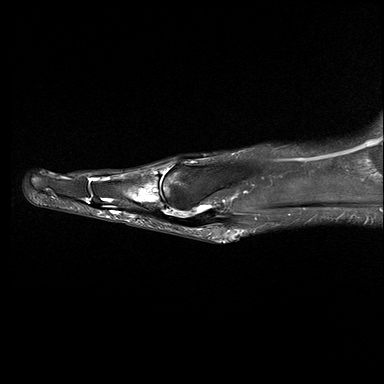
[im 28/28]
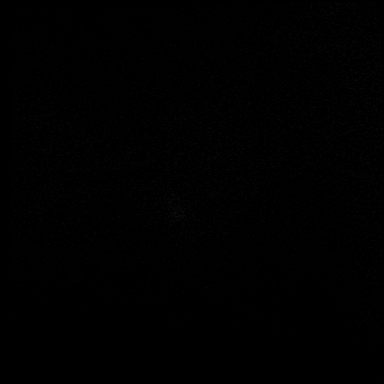

[Series 7: T1 · axial · left · 2.5mm · 0.36mm/px · 1 of 25 slices shown (2 of 2)]
[im 1/25]
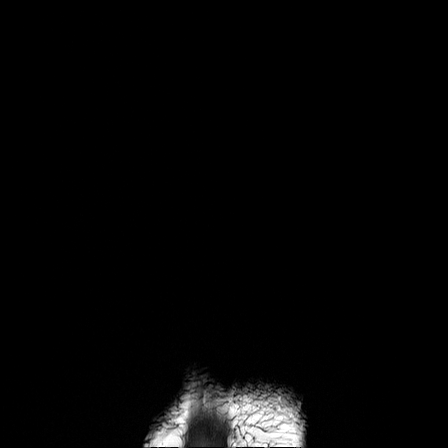

[Series 8: T2 fat-sat · axial · left · 2.5mm · 0.36mm/px · z∈[-23,+35]mm · 5 of 25 slices shown (3 of 3)]
[im 1/25]
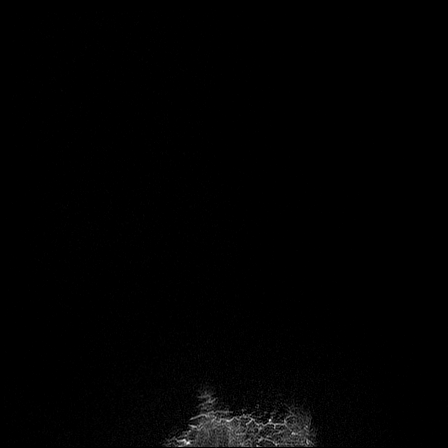
[im 7/25]
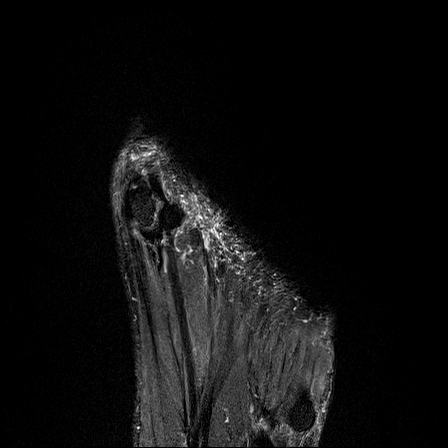
[im 13/25]
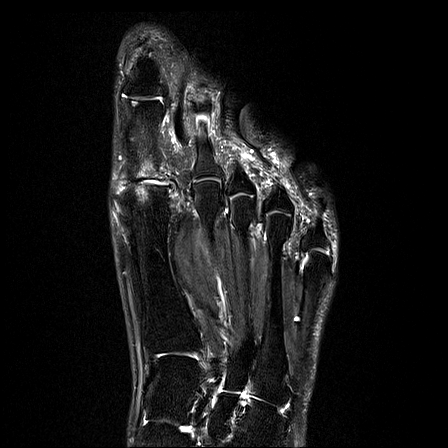
[im 19/25]
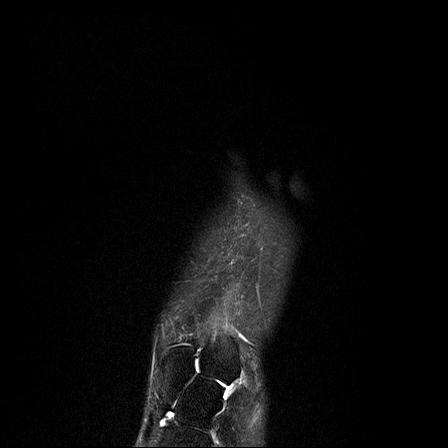
[im 25/25]
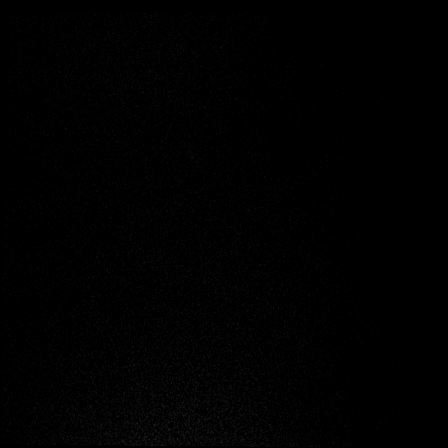

[30 of 40 positions shown; findings below may reference images not displayed]

FINDINGS: The patient has advanced first MTP osteoarthritis with bone-on-bone
joint space narrowing. Subchondral edema and cyst formation are
worse in the proximal phalanx of the great toe. There is
osteophytosis about the joint. Mild degenerative changes seen at the
IP joint of the great toe where a tiny subchondral cyst in the head
of the proximal phalanx is identified. Cortical thickening along the
diaphysis of the second metatarsal is identified with low level
marrow edema present consistent with subacute stress fracture.
Imaged bones otherwise appear normal.

Fluid is seen in the first, second and third intermetatarsal spaces
compatible with bursitis, most notable between the heads of the
first and second metatarsals. Imaged muscles and tendons are intact.
No mass is identified.
IMPRESSION: Subacute, healing stress fracture diaphysis of the second
metatarsal.

Advanced first MTP osteoarthritis. Mild degenerative change about
the IP joint of the great toe also noted.

Findings compatible with intermetatarsal bursitis with at the first
through third intermetatarsal spaces, most notable at the first
intermetatarsal space.

## 2017-03-10 DIAGNOSIS — S83241A Other tear of medial meniscus, current injury, right knee, initial encounter: Secondary | ICD-10-CM | POA: Diagnosis not present

## 2017-03-14 DIAGNOSIS — M25561 Pain in right knee: Secondary | ICD-10-CM | POA: Diagnosis not present

## 2017-03-16 DIAGNOSIS — S83241D Other tear of medial meniscus, current injury, right knee, subsequent encounter: Secondary | ICD-10-CM | POA: Diagnosis not present

## 2017-03-26 DIAGNOSIS — F09 Unspecified mental disorder due to known physiological condition: Secondary | ICD-10-CM | POA: Diagnosis not present

## 2017-03-26 DIAGNOSIS — F331 Major depressive disorder, recurrent, moderate: Secondary | ICD-10-CM | POA: Diagnosis not present

## 2017-03-26 DIAGNOSIS — F411 Generalized anxiety disorder: Secondary | ICD-10-CM | POA: Diagnosis not present

## 2017-04-13 DIAGNOSIS — F331 Major depressive disorder, recurrent, moderate: Secondary | ICD-10-CM | POA: Diagnosis not present

## 2017-04-13 DIAGNOSIS — F09 Unspecified mental disorder due to known physiological condition: Secondary | ICD-10-CM | POA: Diagnosis not present

## 2017-04-13 DIAGNOSIS — F411 Generalized anxiety disorder: Secondary | ICD-10-CM | POA: Diagnosis not present

## 2017-04-16 DIAGNOSIS — R4189 Other symptoms and signs involving cognitive functions and awareness: Secondary | ICD-10-CM | POA: Diagnosis not present

## 2017-04-16 DIAGNOSIS — G3184 Mild cognitive impairment, so stated: Secondary | ICD-10-CM | POA: Diagnosis not present

## 2017-04-23 DIAGNOSIS — H1131 Conjunctival hemorrhage, right eye: Secondary | ICD-10-CM | POA: Diagnosis not present

## 2017-05-05 DIAGNOSIS — R413 Other amnesia: Secondary | ICD-10-CM | POA: Diagnosis not present

## 2017-05-05 DIAGNOSIS — G3184 Mild cognitive impairment, so stated: Secondary | ICD-10-CM | POA: Diagnosis not present

## 2017-05-07 DIAGNOSIS — M810 Age-related osteoporosis without current pathological fracture: Secondary | ICD-10-CM | POA: Diagnosis not present

## 2017-05-11 DIAGNOSIS — F09 Unspecified mental disorder due to known physiological condition: Secondary | ICD-10-CM | POA: Diagnosis not present

## 2017-05-11 DIAGNOSIS — F411 Generalized anxiety disorder: Secondary | ICD-10-CM | POA: Diagnosis not present

## 2017-05-11 DIAGNOSIS — F331 Major depressive disorder, recurrent, moderate: Secondary | ICD-10-CM | POA: Diagnosis not present

## 2017-05-12 DIAGNOSIS — M25572 Pain in left ankle and joints of left foot: Secondary | ICD-10-CM | POA: Diagnosis not present

## 2017-05-12 DIAGNOSIS — R509 Fever, unspecified: Secondary | ICD-10-CM | POA: Diagnosis not present

## 2017-05-13 DIAGNOSIS — R509 Fever, unspecified: Secondary | ICD-10-CM | POA: Diagnosis not present

## 2017-05-22 DIAGNOSIS — R7301 Impaired fasting glucose: Secondary | ICD-10-CM | POA: Diagnosis not present

## 2017-05-26 DIAGNOSIS — R7301 Impaired fasting glucose: Secondary | ICD-10-CM | POA: Diagnosis not present

## 2017-05-28 DIAGNOSIS — F411 Generalized anxiety disorder: Secondary | ICD-10-CM | POA: Diagnosis not present

## 2017-05-28 DIAGNOSIS — F09 Unspecified mental disorder due to known physiological condition: Secondary | ICD-10-CM | POA: Diagnosis not present

## 2017-05-28 DIAGNOSIS — F331 Major depressive disorder, recurrent, moderate: Secondary | ICD-10-CM | POA: Diagnosis not present

## 2017-06-08 DIAGNOSIS — F331 Major depressive disorder, recurrent, moderate: Secondary | ICD-10-CM | POA: Diagnosis not present

## 2017-06-08 DIAGNOSIS — F09 Unspecified mental disorder due to known physiological condition: Secondary | ICD-10-CM | POA: Diagnosis not present

## 2017-06-08 DIAGNOSIS — F411 Generalized anxiety disorder: Secondary | ICD-10-CM | POA: Diagnosis not present

## 2017-06-25 DIAGNOSIS — J029 Acute pharyngitis, unspecified: Secondary | ICD-10-CM | POA: Diagnosis not present

## 2017-06-30 DIAGNOSIS — F411 Generalized anxiety disorder: Secondary | ICD-10-CM | POA: Diagnosis not present

## 2017-06-30 DIAGNOSIS — F09 Unspecified mental disorder due to known physiological condition: Secondary | ICD-10-CM | POA: Diagnosis not present

## 2017-06-30 DIAGNOSIS — F331 Major depressive disorder, recurrent, moderate: Secondary | ICD-10-CM | POA: Diagnosis not present

## 2017-07-14 DIAGNOSIS — F331 Major depressive disorder, recurrent, moderate: Secondary | ICD-10-CM | POA: Diagnosis not present

## 2017-07-14 DIAGNOSIS — F411 Generalized anxiety disorder: Secondary | ICD-10-CM | POA: Diagnosis not present

## 2017-07-14 DIAGNOSIS — F09 Unspecified mental disorder due to known physiological condition: Secondary | ICD-10-CM | POA: Diagnosis not present

## 2017-07-22 DIAGNOSIS — N899 Noninflammatory disorder of vagina, unspecified: Secondary | ICD-10-CM | POA: Diagnosis not present

## 2017-07-23 DIAGNOSIS — L723 Sebaceous cyst: Secondary | ICD-10-CM

## 2017-07-23 DIAGNOSIS — L720 Epidermal cyst: Secondary | ICD-10-CM | POA: Diagnosis not present

## 2017-07-23 HISTORY — DX: Sebaceous cyst: L72.3

## 2017-07-24 DIAGNOSIS — R0602 Shortness of breath: Secondary | ICD-10-CM | POA: Diagnosis not present

## 2017-07-24 DIAGNOSIS — R002 Palpitations: Secondary | ICD-10-CM | POA: Diagnosis not present

## 2017-07-24 DIAGNOSIS — R079 Chest pain, unspecified: Secondary | ICD-10-CM | POA: Diagnosis not present

## 2017-07-24 DIAGNOSIS — F419 Anxiety disorder, unspecified: Secondary | ICD-10-CM | POA: Diagnosis not present

## 2017-07-25 ENCOUNTER — Encounter (HOSPITAL_COMMUNITY): Payer: Self-pay | Admitting: Emergency Medicine

## 2017-07-25 ENCOUNTER — Emergency Department (HOSPITAL_COMMUNITY)
Admission: EM | Admit: 2017-07-25 | Discharge: 2017-07-25 | Disposition: A | Discharge status: Home / Self Care | Payer: PPO | Attending: Emergency Medicine | Admitting: Emergency Medicine

## 2017-07-25 ENCOUNTER — Other Ambulatory Visit: Payer: Self-pay

## 2017-07-25 ENCOUNTER — Emergency Department (HOSPITAL_COMMUNITY): Payer: PPO

## 2017-07-25 DIAGNOSIS — Z5321 Procedure and treatment not carried out due to patient leaving prior to being seen by health care provider: Secondary | ICD-10-CM | POA: Diagnosis not present

## 2017-07-25 DIAGNOSIS — R079 Chest pain, unspecified: Secondary | ICD-10-CM | POA: Diagnosis not present

## 2017-07-25 DIAGNOSIS — R9431 Abnormal electrocardiogram [ECG] [EKG]: Secondary | ICD-10-CM | POA: Diagnosis not present

## 2017-07-25 LAB — CBC
HCT: 39.2 % (ref 36.0–46.0)
Hemoglobin: 12.9 g/dL (ref 12.0–15.0)
MCH: 30.3 pg (ref 26.0–34.0)
MCHC: 32.9 g/dL (ref 30.0–36.0)
MCV: 92 fL (ref 78.0–100.0)
PLATELETS: 193 10*3/uL (ref 150–400)
RBC: 4.26 MIL/uL (ref 3.87–5.11)
RDW: 13.1 % (ref 11.5–15.5)
WBC: 6.9 10*3/uL (ref 4.0–10.5)

## 2017-07-25 LAB — BASIC METABOLIC PANEL
Anion gap: 9 (ref 5–15)
BUN: 14 mg/dL (ref 6–20)
CHLORIDE: 109 mmol/L (ref 101–111)
CO2: 23 mmol/L (ref 22–32)
CREATININE: 0.99 mg/dL (ref 0.44–1.00)
Calcium: 9.8 mg/dL (ref 8.9–10.3)
GFR calc Af Amer: >60 mL/min (ref >60)
GFR calc non Af Amer: 57 mL/min — ABNORMAL LOW (ref >60)
GLUCOSE: 95 mg/dL (ref 65–99)
Potassium: 4.2 mmol/L (ref 3.5–5.1)
SODIUM: 141 mmol/L (ref 135–145)

## 2017-07-25 LAB — I-STAT TROPONIN, ED: Troponin i, poc: 0 ng/mL (ref 0.00–0.08)

## 2017-07-25 NOTE — ED Triage Notes (Signed)
Patient to ED c/o chest pressure intermittent over the last few days - reports she woke up with it central chest this morning and it is now on the left side under her breast. Pt states she has been under some emotional stress. Denies SOB, no dizziness/lightheadedness, or N/V. Resp e/u, skin warm/dry.

## 2017-07-25 NOTE — ED Notes (Signed)
Pt LWBS pt encouraged to stay but refused

## 2017-07-28 DIAGNOSIS — F09 Unspecified mental disorder due to known physiological condition: Secondary | ICD-10-CM | POA: Diagnosis not present

## 2017-07-28 DIAGNOSIS — F411 Generalized anxiety disorder: Secondary | ICD-10-CM | POA: Diagnosis not present

## 2017-07-28 DIAGNOSIS — F331 Major depressive disorder, recurrent, moderate: Secondary | ICD-10-CM | POA: Diagnosis not present

## 2017-08-04 DIAGNOSIS — R9431 Abnormal electrocardiogram [ECG] [EKG]: Secondary | ICD-10-CM | POA: Diagnosis not present

## 2017-08-04 DIAGNOSIS — R002 Palpitations: Secondary | ICD-10-CM | POA: Diagnosis not present

## 2017-08-04 DIAGNOSIS — I2510 Atherosclerotic heart disease of native coronary artery without angina pectoris: Secondary | ICD-10-CM | POA: Diagnosis not present

## 2017-08-04 DIAGNOSIS — R0602 Shortness of breath: Secondary | ICD-10-CM | POA: Diagnosis not present

## 2017-08-04 DIAGNOSIS — K219 Gastro-esophageal reflux disease without esophagitis: Secondary | ICD-10-CM | POA: Diagnosis not present

## 2017-08-04 DIAGNOSIS — R079 Chest pain, unspecified: Secondary | ICD-10-CM | POA: Diagnosis not present

## 2017-08-10 DIAGNOSIS — R110 Nausea: Secondary | ICD-10-CM | POA: Diagnosis not present

## 2017-08-10 DIAGNOSIS — R195 Other fecal abnormalities: Secondary | ICD-10-CM | POA: Diagnosis not present

## 2017-08-12 ENCOUNTER — Emergency Department (HOSPITAL_COMMUNITY): Payer: PPO

## 2017-08-12 ENCOUNTER — Emergency Department (HOSPITAL_COMMUNITY)
Admission: EM | Admit: 2017-08-12 | Discharge: 2017-08-12 | Disposition: A | Discharge status: Home / Self Care | Payer: PPO | Attending: Emergency Medicine | Admitting: Emergency Medicine

## 2017-08-12 ENCOUNTER — Encounter (HOSPITAL_COMMUNITY): Payer: Self-pay

## 2017-08-12 DIAGNOSIS — Y92009 Unspecified place in unspecified non-institutional (private) residence as the place of occurrence of the external cause: Secondary | ICD-10-CM | POA: Insufficient documentation

## 2017-08-12 DIAGNOSIS — Y999 Unspecified external cause status: Secondary | ICD-10-CM | POA: Diagnosis not present

## 2017-08-12 DIAGNOSIS — M25551 Pain in right hip: Secondary | ICD-10-CM | POA: Diagnosis not present

## 2017-08-12 DIAGNOSIS — W010XXA Fall on same level from slipping, tripping and stumbling without subsequent striking against object, initial encounter: Secondary | ICD-10-CM | POA: Insufficient documentation

## 2017-08-12 DIAGNOSIS — S32511A Fracture of superior rim of right pubis, initial encounter for closed fracture: Secondary | ICD-10-CM | POA: Diagnosis not present

## 2017-08-12 DIAGNOSIS — S79911A Unspecified injury of right hip, initial encounter: Secondary | ICD-10-CM | POA: Diagnosis not present

## 2017-08-12 DIAGNOSIS — M5489 Other dorsalgia: Secondary | ICD-10-CM | POA: Diagnosis not present

## 2017-08-12 DIAGNOSIS — Y939 Activity, unspecified: Secondary | ICD-10-CM | POA: Insufficient documentation

## 2017-08-12 DIAGNOSIS — F331 Major depressive disorder, recurrent, moderate: Secondary | ICD-10-CM | POA: Diagnosis not present

## 2017-08-12 DIAGNOSIS — S3993XA Unspecified injury of pelvis, initial encounter: Secondary | ICD-10-CM | POA: Diagnosis present

## 2017-08-12 DIAGNOSIS — R52 Pain, unspecified: Secondary | ICD-10-CM | POA: Diagnosis not present

## 2017-08-12 DIAGNOSIS — Z79899 Other long term (current) drug therapy: Secondary | ICD-10-CM | POA: Insufficient documentation

## 2017-08-12 DIAGNOSIS — S3210XA Unspecified fracture of sacrum, initial encounter for closed fracture: Secondary | ICD-10-CM

## 2017-08-12 DIAGNOSIS — I959 Hypotension, unspecified: Secondary | ICD-10-CM | POA: Diagnosis not present

## 2017-08-12 DIAGNOSIS — Z7982 Long term (current) use of aspirin: Secondary | ICD-10-CM | POA: Insufficient documentation

## 2017-08-12 DIAGNOSIS — R42 Dizziness and giddiness: Secondary | ICD-10-CM | POA: Diagnosis not present

## 2017-08-12 DIAGNOSIS — S3219XA Other fracture of sacrum, initial encounter for closed fracture: Secondary | ICD-10-CM | POA: Diagnosis not present

## 2017-08-12 DIAGNOSIS — F411 Generalized anxiety disorder: Secondary | ICD-10-CM | POA: Diagnosis not present

## 2017-08-12 DIAGNOSIS — F09 Unspecified mental disorder due to known physiological condition: Secondary | ICD-10-CM | POA: Diagnosis not present

## 2017-08-12 DIAGNOSIS — W19XXXA Unspecified fall, initial encounter: Secondary | ICD-10-CM

## 2017-08-12 LAB — CBC WITH DIFFERENTIAL/PLATELET
ABS IMMATURE GRANULOCYTES: 0.1 10*3/uL (ref 0.0–0.1)
BASOS PCT: 0 %
Basophils Absolute: 0 10*3/uL (ref 0.0–0.1)
Eosinophils Absolute: 0.1 10*3/uL (ref 0.0–0.7)
Eosinophils Relative: 1 %
HEMATOCRIT: 37.9 % (ref 36.0–46.0)
HEMOGLOBIN: 12.3 g/dL (ref 12.0–15.0)
Immature Granulocytes: 1 %
LYMPHS ABS: 1.8 10*3/uL (ref 0.7–4.0)
LYMPHS PCT: 33 %
MCH: 30.3 pg (ref 26.0–34.0)
MCHC: 32.5 g/dL (ref 30.0–36.0)
MCV: 93.3 fL (ref 78.0–100.0)
MONO ABS: 0.3 10*3/uL (ref 0.1–1.0)
MONOS PCT: 6 %
NEUTROS ABS: 3.2 10*3/uL (ref 1.7–7.7)
Neutrophils Relative %: 59 %
PLATELETS: 170 10*3/uL (ref 150–400)
RBC: 4.06 MIL/uL (ref 3.87–5.11)
RDW: 13 % (ref 11.5–15.5)
WBC: 5.4 10*3/uL (ref 4.0–10.5)

## 2017-08-12 LAB — BASIC METABOLIC PANEL
Anion gap: 10 (ref 5–15)
BUN: 12 mg/dL (ref 6–20)
CHLORIDE: 104 mmol/L (ref 101–111)
CO2: 26 mmol/L (ref 22–32)
Calcium: 9.2 mg/dL (ref 8.9–10.3)
Creatinine, Ser: 0.85 mg/dL (ref 0.44–1.00)
GFR calc non Af Amer: >60 mL/min (ref >60)
GLUCOSE: 123 mg/dL — AB (ref 65–99)
POTASSIUM: 3.6 mmol/L (ref 3.5–5.1)
Sodium: 140 mmol/L (ref 135–145)

## 2017-08-12 MED ORDER — OXYCODONE-ACETAMINOPHEN 5-325 MG PO TABS: 1.0000 | ORAL_TABLET | ORAL | 0 refills | Status: DC | PRN

## 2017-08-12 MED ORDER — FENTANYL CITRATE (PF) 100 MCG/2ML IJ SOLN
50.0000 ug | Freq: Once | INTRAMUSCULAR | Status: AC
Start: 2017-08-12 — End: 2017-08-12
  Administered 2017-08-12: 50 ug via INTRAMUSCULAR
  Filled 2017-08-12: qty 2

## 2017-08-12 NOTE — Discharge Instructions (Addendum)
Your xrays show a right superior pubic rami fracture and a fracture of the sacrum. Please call orthopedics tomorrow to schedule follow up.    For breakthrough pain you may take Percocet. Do not drink alcohol drive or operate heavy machinery when taking. You are being provided a prescription for opiates (also known as narcotics) for pain control on an ?as needed? basis.  Opiates can be addictive and should only be used when absolutely necessary for pain control when other alternatives do not work.  We recommend you only use them for the recommended amount of time and only as prescribed.  Please do not take with other sedative medications or alcohol.  Please do not drive, operate machinery, or make important decisions while taking opiates.  Please note that these medications can be addictive and have high abuse potential.  Please keep these medications locked away from children, teenagers or any family members with history of substance abuse. Additionally, these medications may cause constipation - take over the counter stool softeners or add fiber to your diet to treat this (Metamucil, Psyllium Fiber, Colace, Miralax) Further refills will need to be obtained from your primary care doctor and will not be prescribed through the Emergency Department. You will test positive on most drug tests while taking this medication.   Please return if your pain is not controlled at home, you are unable to bear weight/walk, you develop abdominal pain, you notice blood in your urine, you lose control of your urine or bowels, cannot feel when you wipe after going to the bathroom, develop numbness of the extremities or have any other concerns.

## 2017-08-12 NOTE — ED Provider Notes (Signed)
[place] EMERGENCY DEPARTMENT Provider Note   CSN: [id number] Arrival date & time: 08/12/17  1439     History   Chief Complaint Chief Complaint  Patient presents with  . Fall    HPI Martha Harrell is a 67 y.o. female with a history of arthritis and osteoporosis who presents emergency department today for fall.  Patient states that she had a mechanical fall approximately 1:30 PM today while at home with her husband.  She reports falling onto her right buttocks and was unable to get up.  She was not down for a prolonged period of time.  She denies any head injury, loss of consciousness, dizziness, chest pain or shortness of breath that preceded this.  She reports now that she has right hip pain as well as some mild low back pain.  She denies any bowel/bladder incontinence.  She has not had anything for pain.  She describes her pain as a achy/throbbing pain.  Rates her pain level as a 10/10.  She denies any numbness/tingling/weakness.  Denies prior hip surgery.  No open wounds.  HPI  Past Medical History:  Diagnosis Date  . Allergy   . Anxiety   . Arthritis   . Depression   . GERD (gastroesophageal reflux disease)   . Hyperlipidemia   . Osteoporosis   . Thyroid disease     Patient Active Problem List   Diagnosis Date Noted  . Cognitive disorder 07/31/2012    Past Surgical History:  Procedure Laterality Date  . BACK SURGERY    . FOOT SURGERY Right    rod to R leg per tumor removal     OB History   None      Home Medications    Prior to Admission medications   Medication Sig Start Date End Date Taking? Authorizing Provider  acyclovir (ZOVIRAX) 200 MG capsule Take 200 mg by mouth 5 (five) times daily.    [provider]  acyclovir (ZOVIRAX) 200 MG capsule TAKE ONE CAPSULE BY MOUTH TWICE A DAY 06/01/15   [provider]  Ascorbic Acid (VITAMIN C) 1000 MG tablet Take 1,000 mg by mouth. 06/18/09   [provider]  aspirin  81 MG tablet Take 81 mg by mouth daily.    [provider]  aspirin EC 81 MG tablet Take 81 mg by mouth. 06/14/09   [provider]  CALCIUM PO Take 250 mg by mouth.    [provider]  carisoprodol (SOMA) 350 MG tablet Take 350 mg by mouth 4 (four) times daily as needed for muscle spasms.    [provider]  carisoprodol (SOMA) 350 MG tablet Take 350 mg by mouth. 06/18/09   [provider]  celecoxib (CELEBREX) 200 MG capsule Take 200 mg by mouth. 05/13/12   [provider]  cephALEXin (KEFLEX) 500 MG capsule Take 1 capsule (500 mg total) by mouth 2 (two) times daily. 05/26/16   [name], MD  cetirizine (ZYRTEC) 10 MG tablet Take 10 mg by mouth.    [provider]  Cholecalciferol (VITAMIN D3) 2000 units capsule Take by mouth. 06/18/09   [provider]  citalopram (CELEXA) 40 MG tablet Take 40 mg by mouth daily.    [provider]  citalopram (CELEXA) 40 MG tablet Take 40 mg by mouth.    [provider]  clonazePAM [facility]) 1 MG tablet  11/26/15   [provider]  DULoxetine (CYMBALTA) 60 MG capsule  12/01/15  [provider]  estradiol (ESTRACE) 0.1 MG/GM vaginal cream Place 1 Applicatorful vaginally at bedtime.    [provider]  estradiol (ESTRACE) 0.1 MG/GM vaginal cream Place vaginally.    [provider]  fluconazole (DIFLUCAN) 150 MG tablet TAKE 1 TABLET (150 MG TOTAL) BY MOUTH ONCE. Patient not taking: Reported on 05/12/2016 04/21/13   Wallene Huh, DPM  fluconazole (DIFLUCAN) 150 MG tablet Take 1 tablet (150 mg total) by mouth once. Patient not taking: Reported on 05/12/2016 04/21/13   Wallene Huh, DPM  fluticasone [facility]) 50 MCG/ACT nasal spray USE 2 SPRAYS IN EACH NOSTRIL ONCE DAILY 10/03/15   [provider]  fluticasone (VERAMYST) 27.5 MCG/SPRAY nasal spray Place 2 sprays into the nose daily. 27.5 cg/spray/lc    [provider]    FLUZONE HIGH-DOSE 0.5 ML SUSY  10/09/15   [provider]  gabapentin (NEURONTIN) 400 MG capsule Take 400 mg by mouth 3 (three) times daily.    [provider]  gabapentin (NEURONTIN) 400 MG capsule TAKE ONE CAPSULE BY MOUTH EVERY MORNING, 1 IN THE AFTERNOON, AND 4 AT BEDTIME 09/04/15   [provider]  HYDROcodone-acetaminophen (NORCO/VICODIN) 5-325 MG per tablet Take 1 tablet by mouth every 6 (six) hours as needed for moderate pain.    [provider]  hyoscyamine (LEVSIN) 0.125 MG/5ML ELIX Take 0.125 mg by mouth.    [provider]  lidocaine (LIDODERM) 5 % Place 1 patch onto the skin daily. Remove & Discard patch within 12 hours or as directed by MD    [provider]  lidocaine (LIDODERM) 5 % Place onto the skin.    [provider]  Meth-Hyo-M Bl-Na Phos-Ph Sal (URIBEL) 118 MG CAPS Take by mouth.    [provider]  pantoprazole (PROTONIX) 40 MG tablet Take 40 mg by mouth daily.    [provider]  pantoprazole (PROTONIX) 40 MG tablet Take 40 mg by mouth.    [provider]  PREVNAR 13 SUSP injection  11/06/15   [provider]  promethazine (PHENERGAN) 12.5 MG tablet Take 12.5 mg by mouth every 6 (six) hours as needed for nausea or vomiting.    [provider]  promethazine (PHENERGAN) 12.5 MG tablet Take 12.5 mg by mouth.    [provider]  simvastatin (ZOCOR) 40 MG tablet  12/10/15   [provider]    Family History No family history on file.  Social History Social History   Tobacco Use  . Smoking status: Never Smoker  . Smokeless tobacco: Never Used  Substance Use Topics  . Alcohol use: No  . Drug use: No     Allergies   Ciprocinonide [fluocinolone]; Nortriptyline; and Septra [sulfamethoxazole-trimethoprim]   Review of Systems Review of Systems  All other systems reviewed and are negative.    Physical Exam Updated Vital Signs BP 96/66 (BP  Location: Right Arm)   Pulse 80   Temp 98.7 F (37.1 C) (Oral)   Resp 18   SpO2 100%   Physical Exam  Constitutional: She appears well-developed and well-nourished.  HENT:  Head: Normocephalic and atraumatic.  Right Ear: External ear normal.  Left Ear: External ear normal.  Nose: Nose normal.  Mouth/Throat: Uvula is midline, oropharynx is clear and moist and mucous membranes are normal. No tonsillar exudate.  Eyes: Pupils are equal, round, and reactive to light. Right eye exhibits no discharge. Left eye exhibits no discharge. No scleral icterus.  Neck: Trachea normal. Neck supple.  No spinous process tenderness present. No neck rigidity. Normal range of motion present.  Cardiovascular: Normal rate, regular rhythm and intact distal pulses.  No murmur heard. Pulses:      Radial pulses are 2+ on the right side, and 2+ on the left side.       Dorsalis pedis pulses are 2+ on the right side, and 2+ on the left side.       Posterior tibial pulses are 2+ on the right side, and 2+ on the left side.  No lower extremity swelling or edema. Calves symmetric in size bilaterally.  Pulmonary/Chest: Effort normal and breath sounds normal. She exhibits no tenderness.  Abdominal: Soft. Bowel sounds are normal. There is no tenderness. There is no rebound and no guarding.  Musculoskeletal: She exhibits no edema.  No cervical, thoracic spinous tenderness palpation or step-offs.  Patient does have lower lumbar spinous tenderness palpation without step-offs noted.  She also has right-sided paraspinal tenderness.  Patient with tenderness palpation over the right hip.  Negative logroll test.  Patient does have pain on right side when rocking the hips. She has pain with flexion, abduction of the hip.  There is no shortening or external rotation of the hip.  Compartments are soft of the lower extremity.  She is neurovascular intact distally.  Lymphadenopathy:    She has no cervical adenopathy.  Neurological: She is  alert. She has normal strength. No sensory deficit.  Speech clear. Follows commands. No facial droop. PERRLA. EOM grossly intact. CN III-XII grossly intact. Grossly moves all extremities 4 without ataxia. Able and appropriate strength for age to upper and lower extremities bilaterally including grip, plantar flexion and dorsiflexion. Patient reported to be able to ambulate with assistance per nursing note. Will defer until imaging results at this time.   Skin: Skin is warm and dry. No abrasion, no laceration and no rash noted. She is not diaphoretic.  Psychiatric: She has a normal mood and affect.  Nursing note and vitals reviewed.    ED Treatments / Results  Labs (all labs ordered are listed, but only abnormal results are displayed) Labs Reviewed  BASIC METABOLIC PANEL - Abnormal; Notable for the following components:      Result Value   Glucose, Bld 123 (*)    All other components within normal limits  CBC WITH DIFFERENTIAL/PLATELET    EKG None  Radiology Ct Lumbar Spine Wo Contrast  Result Date: 08/12/2017 CLINICAL DATA:  Back pain after tripping and falling onto a hardwood floor. Initial encounter. EXAM: CT LUMBAR SPINE WITHOUT CONTRAST TECHNIQUE: Multidetector CT imaging of the lumbar spine was performed without intravenous contrast administration. Multiplanar CT image reconstructions were also generated. COMPARISON:  Lumbar CT myelogram 11/03/2012 FINDINGS: Segmentation: 5 lumbar type vertebrae. Alignment: Minimal left convex lumbar curvature.  No listhesis. Vertebrae: Acute, mildly displaced right sacral ala fracture, incompletely imaged. No lumbar spine fracture identified. No suspicious osseous lesion. Unchanged small L1 and L3 superior endplate Schmorl's nodes. Paraspinal and other soft tissues: Mild soft tissue stranding/hematoma anterior to the right sacral fracture. Disc levels: Mild disc bulging and mild-to-moderate facet hypertrophy at L3-4 and L4-5 resulting in at most mild  spinal stenosis at each level. Mild neural foraminal stenosis at L4-5 greater than L3-4. IMPRESSION: 1. Acute right sacral fracture. 2. Mild lumbar disc and facet degeneration without evidence of high-grade stenosis. Electronically Signed   By: Logan Bores M.D.   On: 08/12/2017 17:46   Dg Hip Unilat  With Pelvis 2-3 Views Right  Result Date: 08/12/2017 CLINICAL DATA:  Bilateral hip pain, worse on the right. Patient fell and is able to bear weight and ambulate with assistance. EXAM: DG HIP (WITH OR WITHOUT PELVIS) 2-3V RIGHT COMPARISON:  Hip radiographs 02/22/2016. FINDINGS: AP pelvis with AP and frog-leg lateral views of the right hip. The bones appear mildly demineralized. Patient is status post proximal right femoral ORIF. The hardware is intact without loosening. There is no evidence of proximal right femoral acute fracture or dislocation. There is a possible nondisplaced fracture of the right superior pubic ramus. No other fractures are seen. The sacroiliac joints and symphysis pubis appear intact. The visualized proximal left femur appears intact. Tubal ligation clips are present bilaterally. IMPRESSION: 1. Possible nondisplaced fracture of the right superior pubic ramus. 2. No evidence of acute right hip fracture or dislocation post previous ORIF. Electronically Signed   By: Richardean Sale M.D.   On: 08/12/2017 16:23    Procedures Procedures (including critical care time)  Medications Ordered in ED Medications  fentaNYL (SUBLIMAZE) injection 50 mcg (50 mcg Intramuscular Given 08/12/17 1622)  fentaNYL (SUBLIMAZE) injection 50 mcg (50 mcg Intramuscular Given 08/12/17 1853)     Initial Impression / Assessment and Plan / ED Course  I have reviewed the triage vital signs and the nursing notes.  Pertinent labs & imaging results that were available during my care of the patient were reviewed by me and considered in my medical decision making (see chart for details).     67 y.o. female that  lives at home with her husband presenting today with mechanical fall.  Patient reports that she fell onto her right buttocks.  She is reporting right hip as well as low back pain.  She denies any bowel/bladder incontinence.  She has normal neurologic exam.  No concern for cauda equina.  Patient does have tenderness palpation of the lumbar spine.  Will do CT to evaluate.  Patient also has tenderness palpation of the right hip and pain with rocking of the hips.  Will get x-ray of the right hip and pelvis to evaluate.  Patient is without any shortening or external rotation of the right leg.  She denies any head trauma or loss of consciousness from fall.  Will give pain medication.  Basic labs ordered in triage.  Labs reviewed and reassuring.  X-ray of the right hip and pelvis shows non-displaced fracture through the right superior pubic ramus. Ct scan shows right sacral fracture. Pt without any abdominal TTP. She is NVI. Case discussed with Dr. Alvino Chapel who recommends pain control and ensure patient is able to ambulate and discharge home with pain medication and follow up with orthopedics. No recommendation for CT pelvis at this time.   Patients pain controlled. She is able to ambulate in the department. She is requesting to go home. I advised the patient to follow-up with orthopedist this week. Patient reports she does have walker at home.  Specific return precautions discussed. Time was given for all questions to be answered. The patient verbalized understanding and agreement with plan. The patient appears safe for discharge home.  Prior to discharge, case findings and plan reviewed with Dr. Alvino Chapel. Patient case seen and discussed with Dr. Alvino Chapel who is in agreement with plan.   Final Clinical Impressions(s) / ED Diagnoses   Final diagnoses:  Fall, initial encounter  Closed fracture of right superior pubic ramus, initial encounter ([place])  Closed fracture of sacrum, unspecified portion of sacrum,  initial encounter ([place])  ED Discharge Orders        Ordered    oxyCODONE-acetaminophen (PERCOCET/ROXICET) 5-325 MG tablet  Every 4 hours PRN     08/12/17 1954       Lorelle Gibbs 08/13/17 0344    Davonna Belling, MD 08/14/17 [phone]

## 2017-08-12 NOTE — ED Triage Notes (Signed)
Pt presents for evaluation of fall and bilateral hip pain, worse on R. Mechanical fall, slipped on floor. Pt is able to bear weight and ambulate with assistance. EMS reports no crepitus, deformity, shortening or rotation.

## 2017-08-12 NOTE — ED Notes (Signed)
Pt ambulated around unit with no issues.

## 2017-08-12 NOTE — ED Provider Notes (Signed)
Patient placed in Quick Look pathway, seen and evaluated   Chief Complaint: R hip pain, fall  HPI:   67 year old female presents with R hip pain after a mechanical fall at 1:30PM today. She fell on to her right side and was unable to get up. She denies head injury, LOC, dizziness, back pain, knee pain. She denies hx of fracture.   ROS: +R hip pain  Physical Exam:   Gen: No distress  Neuro: Awake and Alert  Skin: Warm    Focused Exam: Heart regular rate and rhythm    Lungs: CTA    Back: No midline tenderness    R hip: Tenderness over lateral hip   Initiation of care has begun. The patient has been counseled on the process, plan, and necessity for staying for the completion/evaluation, and the remainder of the medical screening examination    Recardo Evangelist, PA-C 08/12/17 [place]    Isla Pence, MD 08/12/17 [phone]

## 2017-08-14 DIAGNOSIS — S3210XA Unspecified fracture of sacrum, initial encounter for closed fracture: Secondary | ICD-10-CM | POA: Diagnosis not present

## 2017-08-18 DIAGNOSIS — M80051D Age-related osteoporosis with current pathological fracture, right femur, subsequent encounter for fracture with routine healing: Secondary | ICD-10-CM | POA: Diagnosis not present

## 2017-08-18 DIAGNOSIS — F419 Anxiety disorder, unspecified: Secondary | ICD-10-CM | POA: Diagnosis not present

## 2017-08-18 DIAGNOSIS — Z9181 History of falling: Secondary | ICD-10-CM | POA: Diagnosis not present

## 2017-08-18 DIAGNOSIS — M1991 Primary osteoarthritis, unspecified site: Secondary | ICD-10-CM | POA: Diagnosis not present

## 2017-08-18 DIAGNOSIS — M5136 Other intervertebral disc degeneration, lumbar region: Secondary | ICD-10-CM | POA: Diagnosis not present

## 2017-08-18 DIAGNOSIS — F329 Major depressive disorder, single episode, unspecified: Secondary | ICD-10-CM | POA: Diagnosis not present

## 2017-08-19 DIAGNOSIS — S32599D Other specified fracture of unspecified pubis, subsequent encounter for fracture with routine healing: Secondary | ICD-10-CM | POA: Diagnosis not present

## 2017-08-19 DIAGNOSIS — E782 Mixed hyperlipidemia: Secondary | ICD-10-CM | POA: Diagnosis not present

## 2017-08-19 DIAGNOSIS — G250 Essential tremor: Secondary | ICD-10-CM | POA: Diagnosis not present

## 2017-08-19 DIAGNOSIS — S3210XD Unspecified fracture of sacrum, subsequent encounter for fracture with routine healing: Secondary | ICD-10-CM | POA: Diagnosis not present

## 2017-08-19 DIAGNOSIS — F0390 Unspecified dementia without behavioral disturbance: Secondary | ICD-10-CM | POA: Diagnosis not present

## 2017-08-24 DIAGNOSIS — S3210XD Unspecified fracture of sacrum, subsequent encounter for fracture with routine healing: Secondary | ICD-10-CM | POA: Diagnosis not present

## 2017-08-25 DIAGNOSIS — M5136 Other intervertebral disc degeneration, lumbar region: Secondary | ICD-10-CM | POA: Diagnosis not present

## 2017-08-25 DIAGNOSIS — M80051D Age-related osteoporosis with current pathological fracture, right femur, subsequent encounter for fracture with routine healing: Secondary | ICD-10-CM | POA: Diagnosis not present

## 2017-08-25 DIAGNOSIS — F419 Anxiety disorder, unspecified: Secondary | ICD-10-CM | POA: Diagnosis not present

## 2017-08-25 DIAGNOSIS — Z9181 History of falling: Secondary | ICD-10-CM | POA: Diagnosis not present

## 2017-08-25 DIAGNOSIS — F329 Major depressive disorder, single episode, unspecified: Secondary | ICD-10-CM | POA: Diagnosis not present

## 2017-08-25 DIAGNOSIS — M1991 Primary osteoarthritis, unspecified site: Secondary | ICD-10-CM | POA: Diagnosis not present

## 2017-08-26 DIAGNOSIS — R350 Frequency of micturition: Secondary | ICD-10-CM | POA: Diagnosis not present

## 2017-08-26 DIAGNOSIS — R300 Dysuria: Secondary | ICD-10-CM | POA: Diagnosis not present

## 2017-08-26 DIAGNOSIS — N3000 Acute cystitis without hematuria: Secondary | ICD-10-CM | POA: Diagnosis not present

## 2017-08-26 DIAGNOSIS — R39198 Other difficulties with micturition: Secondary | ICD-10-CM | POA: Diagnosis not present

## 2017-08-26 DIAGNOSIS — Z6824 Body mass index (BMI) 24.0-24.9, adult: Secondary | ICD-10-CM | POA: Diagnosis not present

## 2017-08-26 DIAGNOSIS — N390 Urinary tract infection, site not specified: Secondary | ICD-10-CM | POA: Diagnosis not present

## 2017-08-31 DIAGNOSIS — Z0000 Encounter for general adult medical examination without abnormal findings: Secondary | ICD-10-CM | POA: Diagnosis not present

## 2017-08-31 DIAGNOSIS — E782 Mixed hyperlipidemia: Secondary | ICD-10-CM | POA: Diagnosis not present

## 2017-08-31 DIAGNOSIS — F0390 Unspecified dementia without behavioral disturbance: Secondary | ICD-10-CM | POA: Diagnosis not present

## 2017-09-01 DIAGNOSIS — Z9181 History of falling: Secondary | ICD-10-CM | POA: Diagnosis not present

## 2017-09-01 DIAGNOSIS — M80051D Age-related osteoporosis with current pathological fracture, right femur, subsequent encounter for fracture with routine healing: Secondary | ICD-10-CM | POA: Diagnosis not present

## 2017-09-01 DIAGNOSIS — M1991 Primary osteoarthritis, unspecified site: Secondary | ICD-10-CM | POA: Diagnosis not present

## 2017-09-01 DIAGNOSIS — F329 Major depressive disorder, single episode, unspecified: Secondary | ICD-10-CM | POA: Diagnosis not present

## 2017-09-01 DIAGNOSIS — M5136 Other intervertebral disc degeneration, lumbar region: Secondary | ICD-10-CM | POA: Diagnosis not present

## 2017-09-01 DIAGNOSIS — F419 Anxiety disorder, unspecified: Secondary | ICD-10-CM | POA: Diagnosis not present

## 2017-09-02 DIAGNOSIS — G8929 Other chronic pain: Secondary | ICD-10-CM | POA: Diagnosis not present

## 2017-09-02 DIAGNOSIS — E782 Mixed hyperlipidemia: Secondary | ICD-10-CM | POA: Diagnosis not present

## 2017-09-02 DIAGNOSIS — F419 Anxiety disorder, unspecified: Secondary | ICD-10-CM | POA: Diagnosis not present

## 2017-09-02 DIAGNOSIS — R739 Hyperglycemia, unspecified: Secondary | ICD-10-CM | POA: Diagnosis not present

## 2017-09-02 DIAGNOSIS — F331 Major depressive disorder, recurrent, moderate: Secondary | ICD-10-CM | POA: Diagnosis not present

## 2017-09-03 DIAGNOSIS — N3000 Acute cystitis without hematuria: Secondary | ICD-10-CM | POA: Diagnosis not present

## 2017-09-03 DIAGNOSIS — Z6824 Body mass index (BMI) 24.0-24.9, adult: Secondary | ICD-10-CM | POA: Diagnosis not present

## 2017-09-03 DIAGNOSIS — R300 Dysuria: Secondary | ICD-10-CM | POA: Diagnosis not present

## 2017-09-07 DIAGNOSIS — F419 Anxiety disorder, unspecified: Secondary | ICD-10-CM | POA: Diagnosis not present

## 2017-09-07 DIAGNOSIS — M5136 Other intervertebral disc degeneration, lumbar region: Secondary | ICD-10-CM | POA: Diagnosis not present

## 2017-09-07 DIAGNOSIS — Z9181 History of falling: Secondary | ICD-10-CM | POA: Diagnosis not present

## 2017-09-07 DIAGNOSIS — M80051D Age-related osteoporosis with current pathological fracture, right femur, subsequent encounter for fracture with routine healing: Secondary | ICD-10-CM | POA: Diagnosis not present

## 2017-09-07 DIAGNOSIS — M1991 Primary osteoarthritis, unspecified site: Secondary | ICD-10-CM | POA: Diagnosis not present

## 2017-09-07 DIAGNOSIS — F329 Major depressive disorder, single episode, unspecified: Secondary | ICD-10-CM | POA: Diagnosis not present

## 2017-09-15 DIAGNOSIS — M1991 Primary osteoarthritis, unspecified site: Secondary | ICD-10-CM | POA: Diagnosis not present

## 2017-09-15 DIAGNOSIS — M80051D Age-related osteoporosis with current pathological fracture, right femur, subsequent encounter for fracture with routine healing: Secondary | ICD-10-CM | POA: Diagnosis not present

## 2017-09-15 DIAGNOSIS — F419 Anxiety disorder, unspecified: Secondary | ICD-10-CM | POA: Diagnosis not present

## 2017-09-15 DIAGNOSIS — Z9181 History of falling: Secondary | ICD-10-CM | POA: Diagnosis not present

## 2017-09-15 DIAGNOSIS — F329 Major depressive disorder, single episode, unspecified: Secondary | ICD-10-CM | POA: Diagnosis not present

## 2017-09-15 DIAGNOSIS — M5136 Other intervertebral disc degeneration, lumbar region: Secondary | ICD-10-CM | POA: Diagnosis not present

## 2017-09-21 DIAGNOSIS — R350 Frequency of micturition: Secondary | ICD-10-CM | POA: Diagnosis not present

## 2017-09-21 DIAGNOSIS — Z6824 Body mass index (BMI) 24.0-24.9, adult: Secondary | ICD-10-CM | POA: Diagnosis not present

## 2017-09-21 DIAGNOSIS — F411 Generalized anxiety disorder: Secondary | ICD-10-CM | POA: Diagnosis not present

## 2017-09-21 DIAGNOSIS — N3010 Interstitial cystitis (chronic) without hematuria: Secondary | ICD-10-CM | POA: Diagnosis not present

## 2017-09-21 DIAGNOSIS — S3210XD Unspecified fracture of sacrum, subsequent encounter for fracture with routine healing: Secondary | ICD-10-CM | POA: Diagnosis not present

## 2017-09-21 DIAGNOSIS — F331 Major depressive disorder, recurrent, moderate: Secondary | ICD-10-CM | POA: Diagnosis not present

## 2017-09-21 DIAGNOSIS — F09 Unspecified mental disorder due to known physiological condition: Secondary | ICD-10-CM | POA: Diagnosis not present

## 2017-09-22 DIAGNOSIS — N3010 Interstitial cystitis (chronic) without hematuria: Secondary | ICD-10-CM | POA: Diagnosis not present

## 2017-09-22 DIAGNOSIS — R350 Frequency of micturition: Secondary | ICD-10-CM | POA: Diagnosis not present

## 2017-10-01 DIAGNOSIS — R634 Abnormal weight loss: Secondary | ICD-10-CM | POA: Diagnosis not present

## 2017-10-01 DIAGNOSIS — Z6822 Body mass index (BMI) 22.0-22.9, adult: Secondary | ICD-10-CM | POA: Diagnosis not present

## 2017-10-01 DIAGNOSIS — G8929 Other chronic pain: Secondary | ICD-10-CM | POA: Diagnosis not present

## 2017-10-01 DIAGNOSIS — F0390 Unspecified dementia without behavioral disturbance: Secondary | ICD-10-CM | POA: Diagnosis not present

## 2017-10-01 DIAGNOSIS — F331 Major depressive disorder, recurrent, moderate: Secondary | ICD-10-CM | POA: Diagnosis not present

## 2017-10-07 DIAGNOSIS — F331 Major depressive disorder, recurrent, moderate: Secondary | ICD-10-CM | POA: Diagnosis not present

## 2017-10-07 DIAGNOSIS — F411 Generalized anxiety disorder: Secondary | ICD-10-CM | POA: Diagnosis not present

## 2017-10-07 DIAGNOSIS — F09 Unspecified mental disorder due to known physiological condition: Secondary | ICD-10-CM | POA: Diagnosis not present

## 2017-10-08 ENCOUNTER — Other Ambulatory Visit: Payer: Self-pay | Admitting: Family Medicine

## 2017-10-08 DIAGNOSIS — R5381 Other malaise: Secondary | ICD-10-CM

## 2017-10-08 DIAGNOSIS — Z1231 Encounter for screening mammogram for malignant neoplasm of breast: Secondary | ICD-10-CM

## 2017-10-12 DIAGNOSIS — R350 Frequency of micturition: Secondary | ICD-10-CM | POA: Diagnosis not present

## 2017-10-12 DIAGNOSIS — N3010 Interstitial cystitis (chronic) without hematuria: Secondary | ICD-10-CM | POA: Diagnosis not present

## 2017-10-12 DIAGNOSIS — G8929 Other chronic pain: Secondary | ICD-10-CM | POA: Diagnosis not present

## 2017-10-12 DIAGNOSIS — F0390 Unspecified dementia without behavioral disturbance: Secondary | ICD-10-CM | POA: Diagnosis not present

## 2017-10-15 DIAGNOSIS — Z6821 Body mass index (BMI) 21.0-21.9, adult: Secondary | ICD-10-CM | POA: Diagnosis not present

## 2017-10-15 DIAGNOSIS — L22 Diaper dermatitis: Secondary | ICD-10-CM | POA: Diagnosis not present

## 2017-10-19 ENCOUNTER — Other Ambulatory Visit: Payer: Self-pay | Admitting: Family Medicine

## 2017-10-19 DIAGNOSIS — F331 Major depressive disorder, recurrent, moderate: Secondary | ICD-10-CM | POA: Diagnosis not present

## 2017-10-19 DIAGNOSIS — F411 Generalized anxiety disorder: Secondary | ICD-10-CM | POA: Diagnosis not present

## 2017-10-19 DIAGNOSIS — E2839 Other primary ovarian failure: Secondary | ICD-10-CM

## 2017-10-19 DIAGNOSIS — F09 Unspecified mental disorder due to known physiological condition: Secondary | ICD-10-CM | POA: Diagnosis not present

## 2017-10-20 DIAGNOSIS — N951 Menopausal and female climacteric states: Secondary | ICD-10-CM | POA: Diagnosis not present

## 2017-10-20 DIAGNOSIS — E539 Vitamin B deficiency, unspecified: Secondary | ICD-10-CM | POA: Diagnosis not present

## 2017-10-20 DIAGNOSIS — R739 Hyperglycemia, unspecified: Secondary | ICD-10-CM | POA: Diagnosis not present

## 2017-10-20 DIAGNOSIS — E559 Vitamin D deficiency, unspecified: Secondary | ICD-10-CM | POA: Diagnosis not present

## 2017-10-20 DIAGNOSIS — E782 Mixed hyperlipidemia: Secondary | ICD-10-CM | POA: Diagnosis not present

## 2017-10-21 DIAGNOSIS — S3210XD Unspecified fracture of sacrum, subsequent encounter for fracture with routine healing: Secondary | ICD-10-CM | POA: Diagnosis not present

## 2017-10-22 DIAGNOSIS — G8929 Other chronic pain: Secondary | ICD-10-CM | POA: Diagnosis not present

## 2017-10-22 DIAGNOSIS — R634 Abnormal weight loss: Secondary | ICD-10-CM | POA: Diagnosis not present

## 2017-10-22 DIAGNOSIS — N951 Menopausal and female climacteric states: Secondary | ICD-10-CM | POA: Diagnosis not present

## 2017-10-22 DIAGNOSIS — Z6821 Body mass index (BMI) 21.0-21.9, adult: Secondary | ICD-10-CM | POA: Diagnosis not present

## 2017-10-22 DIAGNOSIS — E559 Vitamin D deficiency, unspecified: Secondary | ICD-10-CM | POA: Diagnosis not present

## 2017-11-02 DIAGNOSIS — F411 Generalized anxiety disorder: Secondary | ICD-10-CM | POA: Diagnosis not present

## 2017-11-02 DIAGNOSIS — F331 Major depressive disorder, recurrent, moderate: Secondary | ICD-10-CM | POA: Diagnosis not present

## 2017-11-02 DIAGNOSIS — F09 Unspecified mental disorder due to known physiological condition: Secondary | ICD-10-CM | POA: Diagnosis not present

## 2017-11-05 ENCOUNTER — Ambulatory Visit (INDEPENDENT_AMBULATORY_CARE_PROVIDER_SITE_OTHER): Payer: PPO | Admitting: Neurology

## 2017-11-05 ENCOUNTER — Encounter: Payer: Self-pay | Admitting: Neurology

## 2017-11-05 VITALS — BP 117/70 | HR 85 | Ht 64.0 in | Wt 131.0 lb

## 2017-11-05 DIAGNOSIS — R413 Other amnesia: Secondary | ICD-10-CM

## 2017-11-05 DIAGNOSIS — F418 Other specified anxiety disorders: Secondary | ICD-10-CM

## 2017-11-05 NOTE — Progress Notes (Signed)
Subjective:    Patient ID: Martha Harrell is a 67 y.o. female.  HPI     Star Age, MD, PhD [facility] [address], [address] [place], [place] [address]  Dear Benjamine Mola and Anderson Malta,   I saw your patient, Martha Harrell, upon your kind request in my neurologic clinic today for initial consultation of her facial tremor and memory issues. The patient is accompanied by her BF of 30 years, today. As you know, Martha Harrell is a 67 year old right-handed woman with an underlying medical history of anxiety, depression, recent fall in June 2019 with pelvic fracture, chronic pain, hyperlipidemia, osteoporosis, reflux disease, thyroid disease, arthritis, allergies and borderline overweight state, who has had memory issues and cognitive decline over the past several years. She has previously seen a neurologist through [facility], [name]. I reviewed the office note from 04/16/2017. He reports that she had neuropsychological testing in 2018 with comparison to 2012 and there were moderate degree of depression and mild anxiety and some inconsistencies in the test which were also noted in 2012. She had a brain MRI in 2011 which showed left frontoparietal hypodensity but otherwise was unremarkable. He ordered another MRI. She had a brain MRI without contrast on 05/05/2017 which showed:  IMPRESSION:  Stable negative noncontrast MRI of the head for age.  She has seen Dr. Erling Cruz in psychiatry before. She is currently on clonazepam 1 mg 3 times a day, Cymbalta 60 mg strength once daily, gabapentin 400 mg in the morning and 1600 mg in the evening. I reviewed your office note from 08/19/17.  She had no surgery after the fall this year, follows with Dr. Mardelle Matte. She had neuropsych testing with Dr. Vikki Ports on 11/27/16 and I reviewed the report (husband brought a copy for me): Encounter date 11/27/2016. Referring diagnosis: Memory impairment. Final diagnoses: Unspecified encephalopathy.  Major neurocognitive disorder due to possible Alzheimer's disease. Persistent depressive disorder. Generalized anxiety disorder. She does not typically see Dr. Erling Cruz in psychiatry. She sees her therapist on a regular basis, Martha Pat, PhD, in [place]. Her next appointment is on 11/23/2017, last appointment was last week of August. She is tearful today. She feels that her stressors are increased. Her son moved to [place] a year ago. Her boyfriend interjects that he has been in [place] for at least 2 years. This is her only son. She has an 68 year old granddaughter. She was hoping to visit them this December for Christmas but so far this has not been possible to arrange. He has 2 children from before. She has been divorced for over 30 years. She recently had contact with her ex-husband who now lives in [place] and remarried. Patient reports that her father had dementia, Alzheimer's disease. He lived to be into his 30s. Her brother is 29 years older and she is not sure if he has any memory issues. She adds that she is afraid to ask. She no longer is on narcotic pain medication. She takes tramadol as needed. She admits that her anxiety and depression are not fully controlled and repeatedly becomes tearful. She reports that she is able to drive. She limits her driving typically to familiar places and local distances. Her boyfriend adds that she can get confused if she has to take a different route. She does not drink alcohol. She is a nonsmoker. She does not utilize caffeine.  Her Past Medical History Is Significant For: Past Medical History:  Diagnosis Date  . Allergy   .  Anxiety   . Arthritis   . Depression   . GERD (gastroesophageal reflux disease)   . Hyperlipidemia   . Osteoporosis   . Thyroid disease     Her Past Surgical History Is Significant For: Past Surgical History:  Procedure Laterality Date  . BACK SURGERY    . FOOT SURGERY Right    rod to R leg per tumor removal    Her  Family History Is Significant For: No family history on file.  Her Social History Is Significant For: Social History   Socioeconomic History  . Marital status: Divorced    Spouse name: Not on file  . Number of children: Not on file  . Years of education: Not on file  . Highest education level: Not on file  Occupational History  . Not on file  Social Needs  . Financial resource strain: Not on file  . Food insecurity:    Worry: Not on file    Inability: Not on file  . Transportation needs:    Medical: Not on file    Non-medical: Not on file  Tobacco Use  . Smoking status: Never Smoker  . Smokeless tobacco: Never Used  Substance and Sexual Activity  . Alcohol use: No  . Drug use: No  . Sexual activity: Not on file  Lifestyle  . Physical activity:    Days per week: Not on file    Minutes per session: Not on file  . Stress: Not on file  Relationships  . Social connections:    Talks on phone: Not on file    Gets together: Not on file    Attends religious service: Not on file    Active member of club or organization: Not on file    Attends meetings of clubs or organizations: Not on file    Relationship status: Not on file  Other Topics Concern  . Not on file  Social History Narrative  . Not on file    Her Allergies Are:  Allergies  Allergen Reactions  . Ciprofloxacin Other (See Comments)    welts  . Ciprocinonide [Fluocinolone]   . Nortriptyline     Stomach problems  . Septra [Sulfamethoxazole-Trimethoprim] Other (See Comments)    unknown  :   Her Current Medications Are:  Outpatient Encounter Medications as of 11/05/2017  Medication Sig  . clonazePAM (KLONOPIN) 1 MG tablet Take 2 mg by mouth at bedtime.   . fluticasone (VERAMYST) 27.5 MCG/SPRAY nasal spray Place 2 sprays into the nose daily. 27.5 cg/spray/lc  . gabapentin (NEURONTIN) 400 MG capsule Take 400 mg by mouth See admin instructions. 400 mg in the morning and 1600 mg in the eveining  . ondansetron  (ZOFRAN-ODT) 4 MG disintegrating tablet Take 4 mg by mouth as needed.  . simvastatin (ZOCOR) 40 MG tablet Take 40 mg by mouth daily.   . DULoxetine (CYMBALTA) 60 MG capsule Take 60 mg by mouth daily.   . [DISCONTINUED] cephALEXin (KEFLEX) 500 MG capsule Take 1 capsule (500 mg total) by mouth 2 (two) times daily. (Patient not taking: Reported on 08/12/2017)  . [DISCONTINUED] cetirizine (ZYRTEC) 10 MG tablet Take 10 mg by mouth daily.  . [DISCONTINUED] fluconazole (DIFLUCAN) 150 MG tablet TAKE 1 TABLET (150 MG TOTAL) BY MOUTH ONCE. (Patient not taking: Reported on 05/12/2016)  . [DISCONTINUED] fluconazole (DIFLUCAN) 150 MG tablet Take 1 tablet (150 mg total) by mouth once. (Patient not taking: Reported on 05/12/2016)  . [DISCONTINUED] meclizine (ANTIVERT) 25 MG tablet Take 25 mg  by mouth 3 (three) times daily.  . [DISCONTINUED] oxyCODONE-acetaminophen (PERCOCET/ROXICET) 5-325 MG tablet Take 1 tablet by mouth every 4 (four) hours as needed for severe pain.  . [DISCONTINUED] promethazine (PHENERGAN) 25 MG tablet Take 25 mg by mouth every 6 (six) hours as needed for nausea or vomiting.   No facility-administered encounter medications on file as of 11/05/2017.   : Review of Systems:  Out of a complete 14 point review of systems, all are reviewed and negative with the exception of these symptoms as listed below:  Review of Systems  Neurological:       Pt presents today to discuss her memory. Pt denies memory concerns but pt's husband disagrees and says that pt does have memory deficits.    Objective:  Neurological Exam  Physical Exam Physical Examination:   Vitals:   11/05/17 1428  BP: 117/70  Pulse: 85   General Examination: The patient is a very pleasant 67 y.o. female in no acute distress. But she is anxious and tearful. She appears well-developed and well-nourished and well groomed.   HEENT: Normocephalic, atraumatic, pupils are equal, round and reactive to light and accommodation.  Corrective eyeglasses in place. Extraocular tracking is good without limitation to gaze excursion or nystagmus noted. Normal smooth pursuit is noted. Hearing is grossly intact. Face is symmetric with normal facial animation and normal facial sensation. Speech is clear with no dysarthria noted. Tongue protrudes centrally and palate elevates symmetrically. She has no lip, neck or jaw tremor. She has no dyskinesias. Neck is supple with full range of passive and active motion. There are no carotid bruits on auscultation. Oropharynx exam reveals: moderate mouth dryness, adequate dental hygiene.  Chest: Clear to auscultation without wheezing, rhonchi or crackles noted.  Heart: S1+S2+0, regular and normal without murmurs, rubs or gallops noted.   Abdomen: Soft, non-tender and non-distended with normal bowel sounds appreciated on auscultation.  Extremities: There is no pitting edema in the distal lower extremities bilaterally.   Skin: Warm and dry without trophic changes noted.  Musculoskeletal: exam reveals no obvious joint deformities, tenderness or joint swelling or erythema, with the exception of mild pain and decrease in range of motion in the right hip.Marland Kitchen   Neurologically:  Mental status: The patient is awake, alert and oriented in all 4 spheres. Her immediate and remote memory, attention, language skills and fund of knowledge are mildly impaired. She has particular difficulty with details with short-term memory. She is easily distracted. She is quite tearful and appears depressed and anxious.  On 11/05/2017: MMSE: 26/30, CDT: 2/4, AFT: 5/min.  Cranial nerves II - XII are as described above under HEENT exam. In addition: shoulder shrug is normal with equal shoulder height noted. Motor exam: Normal bulk, strength and tone is noted with the exception of mild limitation in the right hip. There is no drift, tremor or rebound. Romberg is negative. Reflexes are 2+ throughout. Fine motor skills and  coordination: grossly intact.  Cerebellar testing: No dysmetria or intention tremor. There is no truncal or gait ataxia.  Sensory exam: intact to light touch, pinprick, vibration, temperature sense and  proprioception in the upper and lower extremities.  Gait, station and balance: She stands with mild difficulty, she stands narrow based and posture is age-appropriate. She walks with a slight limp on the right. She has preserved arm swing, no tremor noted.  Assessment and Plan:   In summary, ZAMARA COZAD is a very pleasant 67 y.o.-year old female with an underlying medical history  of anxiety, depression, recent fall in June 2019 with pelvic fracture, chronic pain, hyperlipidemia, osteoporosis, reflux disease, thyroid disease, arthritis, allergies and borderline overweight state, who presents for evaluation of her memory loss. On examination, she does have difficulty relating her history and especially with chronological details. She has an MMSE of 26 out of 30 which is mildly abnormal. She had an abnormal neuropsychological test in October 2018 but reports that she did not connect well with the psychologist at the time. She is advised that I would like for [place] to pursue neuropsychological testing here locally with Dr. Bonita Quin. She does still have a component of suboptimally treated anxiety and depression, was quite tearful today repeatedly. Nevertheless, further testing is indicated. She is advised to follow-up for routine blood work and care with you and your office. She had an MRI some 6 months ago which actually showed reassuring findings, we can revisit a repeat scan down the road as well. I did not suggest any new medications from my end of things. Of note, she is on several psychotropic medications at this time. I would like to see her back in 6 months, sooner if needed. I answered all their questions today and the patient and her boyfriend were in agreement. Thank you very much for allowing me  to participate in the care of this nice patient. If I can be of any further assistance to you please do not hesitate to call me at [phone].  Sincerely,   Star Age, MD, PhD

## 2017-11-05 NOTE — Patient Instructions (Addendum)
We will request a formal neuropsychological test (aka cognitive testing) for your memory complaints. This requires a referral to a trained and licensed neuropsychologist and will be a separate appointment at a different clinic. Your last test with Dr. Vikki Ports was not fully conclusive, as you state.   You have had a recent brain MRI, which was fine.  Your memory loss is mild, we will continue to monitor.

## 2017-11-06 ENCOUNTER — Encounter: Payer: Self-pay | Admitting: Psychology

## 2017-11-12 DIAGNOSIS — L821 Other seborrheic keratosis: Secondary | ICD-10-CM | POA: Diagnosis not present

## 2017-11-12 DIAGNOSIS — Z1231 Encounter for screening mammogram for malignant neoplasm of breast: Secondary | ICD-10-CM | POA: Diagnosis not present

## 2017-11-12 DIAGNOSIS — D1801 Hemangioma of skin and subcutaneous tissue: Secondary | ICD-10-CM | POA: Diagnosis not present

## 2017-11-12 DIAGNOSIS — L738 Other specified follicular disorders: Secondary | ICD-10-CM | POA: Diagnosis not present

## 2017-11-12 DIAGNOSIS — D485 Neoplasm of uncertain behavior of skin: Secondary | ICD-10-CM | POA: Diagnosis not present

## 2017-11-12 DIAGNOSIS — L814 Other melanin hyperpigmentation: Secondary | ICD-10-CM | POA: Diagnosis not present

## 2017-11-13 ENCOUNTER — Ambulatory Visit: Payer: PPO

## 2017-11-23 DIAGNOSIS — F09 Unspecified mental disorder due to known physiological condition: Secondary | ICD-10-CM | POA: Diagnosis not present

## 2017-11-23 DIAGNOSIS — F331 Major depressive disorder, recurrent, moderate: Secondary | ICD-10-CM | POA: Diagnosis not present

## 2017-11-23 DIAGNOSIS — L22 Diaper dermatitis: Secondary | ICD-10-CM | POA: Diagnosis not present

## 2017-11-23 DIAGNOSIS — Z6821 Body mass index (BMI) 21.0-21.9, adult: Secondary | ICD-10-CM | POA: Diagnosis not present

## 2017-11-23 DIAGNOSIS — F411 Generalized anxiety disorder: Secondary | ICD-10-CM | POA: Diagnosis not present

## 2017-11-25 DIAGNOSIS — F09 Unspecified mental disorder due to known physiological condition: Secondary | ICD-10-CM | POA: Diagnosis not present

## 2017-11-25 DIAGNOSIS — F330 Major depressive disorder, recurrent, mild: Secondary | ICD-10-CM

## 2017-11-25 DIAGNOSIS — F411 Generalized anxiety disorder: Secondary | ICD-10-CM | POA: Diagnosis not present

## 2017-11-25 HISTORY — DX: Major depressive disorder, recurrent, mild: F33.0

## 2017-11-30 DIAGNOSIS — Z6821 Body mass index (BMI) 21.0-21.9, adult: Secondary | ICD-10-CM | POA: Diagnosis not present

## 2017-11-30 DIAGNOSIS — R945 Abnormal results of liver function studies: Secondary | ICD-10-CM | POA: Diagnosis not present

## 2017-11-30 DIAGNOSIS — N3000 Acute cystitis without hematuria: Secondary | ICD-10-CM | POA: Diagnosis not present

## 2017-12-04 DIAGNOSIS — Z1211 Encounter for screening for malignant neoplasm of colon: Secondary | ICD-10-CM | POA: Diagnosis not present

## 2017-12-04 DIAGNOSIS — Z0000 Encounter for general adult medical examination without abnormal findings: Secondary | ICD-10-CM | POA: Diagnosis not present

## 2017-12-07 DIAGNOSIS — S3210XD Unspecified fracture of sacrum, subsequent encounter for fracture with routine healing: Secondary | ICD-10-CM | POA: Diagnosis not present

## 2017-12-08 DIAGNOSIS — F331 Major depressive disorder, recurrent, moderate: Secondary | ICD-10-CM | POA: Diagnosis not present

## 2017-12-08 DIAGNOSIS — F411 Generalized anxiety disorder: Secondary | ICD-10-CM | POA: Diagnosis not present

## 2017-12-08 DIAGNOSIS — F09 Unspecified mental disorder due to known physiological condition: Secondary | ICD-10-CM | POA: Diagnosis not present

## 2017-12-15 DIAGNOSIS — Z79899 Other long term (current) drug therapy: Secondary | ICD-10-CM | POA: Diagnosis not present

## 2017-12-15 DIAGNOSIS — F0390 Unspecified dementia without behavioral disturbance: Secondary | ICD-10-CM | POA: Diagnosis not present

## 2017-12-16 DIAGNOSIS — M810 Age-related osteoporosis without current pathological fracture: Secondary | ICD-10-CM | POA: Diagnosis not present

## 2017-12-16 DIAGNOSIS — E2839 Other primary ovarian failure: Secondary | ICD-10-CM | POA: Diagnosis not present

## 2017-12-17 DIAGNOSIS — R0789 Other chest pain: Secondary | ICD-10-CM | POA: Diagnosis not present

## 2017-12-17 DIAGNOSIS — F419 Anxiety disorder, unspecified: Secondary | ICD-10-CM | POA: Diagnosis not present

## 2017-12-17 DIAGNOSIS — F331 Major depressive disorder, recurrent, moderate: Secondary | ICD-10-CM | POA: Diagnosis not present

## 2017-12-17 DIAGNOSIS — Z6820 Body mass index (BMI) 20.0-20.9, adult: Secondary | ICD-10-CM | POA: Diagnosis not present

## 2017-12-21 DIAGNOSIS — F331 Major depressive disorder, recurrent, moderate: Secondary | ICD-10-CM | POA: Diagnosis not present

## 2017-12-21 DIAGNOSIS — F09 Unspecified mental disorder due to known physiological condition: Secondary | ICD-10-CM | POA: Diagnosis not present

## 2017-12-21 DIAGNOSIS — F411 Generalized anxiety disorder: Secondary | ICD-10-CM | POA: Diagnosis not present

## 2017-12-22 DIAGNOSIS — M810 Age-related osteoporosis without current pathological fracture: Secondary | ICD-10-CM | POA: Diagnosis not present

## 2017-12-22 DIAGNOSIS — E559 Vitamin D deficiency, unspecified: Secondary | ICD-10-CM | POA: Diagnosis not present

## 2017-12-22 DIAGNOSIS — R5383 Other fatigue: Secondary | ICD-10-CM | POA: Diagnosis not present

## 2018-01-14 DIAGNOSIS — S3210XD Unspecified fracture of sacrum, subsequent encounter for fracture with routine healing: Secondary | ICD-10-CM | POA: Diagnosis not present

## 2018-01-14 DIAGNOSIS — M810 Age-related osteoporosis without current pathological fracture: Secondary | ICD-10-CM | POA: Diagnosis not present

## 2018-01-15 DIAGNOSIS — F09 Unspecified mental disorder due to known physiological condition: Secondary | ICD-10-CM | POA: Diagnosis not present

## 2018-01-15 DIAGNOSIS — F411 Generalized anxiety disorder: Secondary | ICD-10-CM | POA: Diagnosis not present

## 2018-01-15 DIAGNOSIS — F331 Major depressive disorder, recurrent, moderate: Secondary | ICD-10-CM | POA: Diagnosis not present

## 2018-01-18 DIAGNOSIS — S3210XD Unspecified fracture of sacrum, subsequent encounter for fracture with routine healing: Secondary | ICD-10-CM | POA: Diagnosis not present

## 2018-01-25 DIAGNOSIS — F0390 Unspecified dementia without behavioral disturbance: Secondary | ICD-10-CM | POA: Diagnosis not present

## 2018-01-25 DIAGNOSIS — F419 Anxiety disorder, unspecified: Secondary | ICD-10-CM | POA: Diagnosis not present

## 2018-01-25 DIAGNOSIS — F331 Major depressive disorder, recurrent, moderate: Secondary | ICD-10-CM | POA: Diagnosis not present

## 2018-01-25 DIAGNOSIS — R0789 Other chest pain: Secondary | ICD-10-CM | POA: Diagnosis not present

## 2018-02-01 DIAGNOSIS — F09 Unspecified mental disorder due to known physiological condition: Secondary | ICD-10-CM | POA: Diagnosis not present

## 2018-02-01 DIAGNOSIS — F331 Major depressive disorder, recurrent, moderate: Secondary | ICD-10-CM | POA: Diagnosis not present

## 2018-02-01 DIAGNOSIS — F411 Generalized anxiety disorder: Secondary | ICD-10-CM | POA: Diagnosis not present

## 2018-02-04 DIAGNOSIS — D485 Neoplasm of uncertain behavior of skin: Secondary | ICD-10-CM | POA: Diagnosis not present

## 2018-02-04 DIAGNOSIS — D1801 Hemangioma of skin and subcutaneous tissue: Secondary | ICD-10-CM | POA: Diagnosis not present

## 2018-02-04 DIAGNOSIS — L821 Other seborrheic keratosis: Secondary | ICD-10-CM | POA: Diagnosis not present

## 2018-02-04 DIAGNOSIS — L820 Inflamed seborrheic keratosis: Secondary | ICD-10-CM | POA: Diagnosis not present

## 2018-02-04 DIAGNOSIS — D2272 Melanocytic nevi of left lower limb, including hip: Secondary | ICD-10-CM | POA: Diagnosis not present

## 2018-02-04 DIAGNOSIS — L570 Actinic keratosis: Secondary | ICD-10-CM | POA: Diagnosis not present

## 2018-02-04 DIAGNOSIS — D225 Melanocytic nevi of trunk: Secondary | ICD-10-CM | POA: Diagnosis not present

## 2018-02-04 DIAGNOSIS — D2271 Melanocytic nevi of right lower limb, including hip: Secondary | ICD-10-CM | POA: Diagnosis not present

## 2018-02-04 DIAGNOSIS — L814 Other melanin hyperpigmentation: Secondary | ICD-10-CM | POA: Diagnosis not present

## 2018-02-10 DIAGNOSIS — M545 Low back pain: Secondary | ICD-10-CM | POA: Diagnosis not present

## 2018-02-10 DIAGNOSIS — M7061 Trochanteric bursitis, right hip: Secondary | ICD-10-CM | POA: Diagnosis not present

## 2018-02-11 DIAGNOSIS — K5900 Constipation, unspecified: Secondary | ICD-10-CM | POA: Diagnosis not present

## 2018-02-11 DIAGNOSIS — F0390 Unspecified dementia without behavioral disturbance: Secondary | ICD-10-CM | POA: Diagnosis not present

## 2018-02-11 DIAGNOSIS — K649 Unspecified hemorrhoids: Secondary | ICD-10-CM | POA: Diagnosis not present

## 2018-02-12 DIAGNOSIS — M25561 Pain in right knee: Secondary | ICD-10-CM | POA: Diagnosis not present

## 2018-03-02 DIAGNOSIS — F0390 Unspecified dementia without behavioral disturbance: Secondary | ICD-10-CM | POA: Diagnosis not present

## 2018-03-02 DIAGNOSIS — K5900 Constipation, unspecified: Secondary | ICD-10-CM | POA: Diagnosis not present

## 2018-03-02 DIAGNOSIS — K649 Unspecified hemorrhoids: Secondary | ICD-10-CM | POA: Diagnosis not present

## 2018-03-18 DIAGNOSIS — N3001 Acute cystitis with hematuria: Secondary | ICD-10-CM | POA: Diagnosis not present

## 2018-03-18 DIAGNOSIS — Z6820 Body mass index (BMI) 20.0-20.9, adult: Secondary | ICD-10-CM | POA: Diagnosis not present

## 2018-04-05 DIAGNOSIS — F411 Generalized anxiety disorder: Secondary | ICD-10-CM | POA: Diagnosis not present

## 2018-04-05 DIAGNOSIS — F331 Major depressive disorder, recurrent, moderate: Secondary | ICD-10-CM | POA: Diagnosis not present

## 2018-04-05 DIAGNOSIS — F09 Unspecified mental disorder due to known physiological condition: Secondary | ICD-10-CM | POA: Diagnosis not present

## 2018-04-19 DIAGNOSIS — D485 Neoplasm of uncertain behavior of skin: Secondary | ICD-10-CM | POA: Diagnosis not present

## 2018-04-19 DIAGNOSIS — L603 Nail dystrophy: Secondary | ICD-10-CM | POA: Diagnosis not present

## 2018-04-19 DIAGNOSIS — L859 Epidermal thickening, unspecified: Secondary | ICD-10-CM | POA: Diagnosis not present

## 2018-04-27 DIAGNOSIS — L821 Other seborrheic keratosis: Secondary | ICD-10-CM | POA: Diagnosis not present

## 2018-04-27 DIAGNOSIS — L603 Nail dystrophy: Secondary | ICD-10-CM | POA: Diagnosis not present

## 2018-04-29 DIAGNOSIS — E559 Vitamin D deficiency, unspecified: Secondary | ICD-10-CM | POA: Diagnosis not present

## 2018-04-29 DIAGNOSIS — F0390 Unspecified dementia without behavioral disturbance: Secondary | ICD-10-CM | POA: Diagnosis not present

## 2018-04-29 DIAGNOSIS — E782 Mixed hyperlipidemia: Secondary | ICD-10-CM | POA: Diagnosis not present

## 2018-05-04 DIAGNOSIS — E559 Vitamin D deficiency, unspecified: Secondary | ICD-10-CM | POA: Diagnosis not present

## 2018-05-04 DIAGNOSIS — F0390 Unspecified dementia without behavioral disturbance: Secondary | ICD-10-CM | POA: Diagnosis not present

## 2018-05-04 DIAGNOSIS — E782 Mixed hyperlipidemia: Secondary | ICD-10-CM | POA: Diagnosis not present

## 2018-05-04 DIAGNOSIS — Z6821 Body mass index (BMI) 21.0-21.9, adult: Secondary | ICD-10-CM | POA: Diagnosis not present

## 2018-05-06 ENCOUNTER — Ambulatory Visit: Payer: PPO | Admitting: Neurology

## 2018-05-10 ENCOUNTER — Encounter: Payer: PPO | Admitting: Psychology

## 2018-06-21 DIAGNOSIS — E782 Mixed hyperlipidemia: Secondary | ICD-10-CM | POA: Diagnosis not present

## 2018-06-21 DIAGNOSIS — Z719 Counseling, unspecified: Secondary | ICD-10-CM | POA: Diagnosis not present

## 2018-06-21 DIAGNOSIS — G4700 Insomnia, unspecified: Secondary | ICD-10-CM | POA: Diagnosis not present

## 2018-06-21 DIAGNOSIS — G8929 Other chronic pain: Secondary | ICD-10-CM | POA: Diagnosis not present

## 2018-06-23 DIAGNOSIS — N390 Urinary tract infection, site not specified: Secondary | ICD-10-CM | POA: Diagnosis not present

## 2018-07-02 DIAGNOSIS — N3010 Interstitial cystitis (chronic) without hematuria: Secondary | ICD-10-CM | POA: Diagnosis not present

## 2018-07-02 DIAGNOSIS — R350 Frequency of micturition: Secondary | ICD-10-CM | POA: Diagnosis not present

## 2018-07-09 DIAGNOSIS — N390 Urinary tract infection, site not specified: Secondary | ICD-10-CM | POA: Diagnosis not present

## 2018-07-20 DIAGNOSIS — M810 Age-related osteoporosis without current pathological fracture: Secondary | ICD-10-CM | POA: Diagnosis not present

## 2018-07-20 DIAGNOSIS — M25561 Pain in right knee: Secondary | ICD-10-CM | POA: Diagnosis not present

## 2018-07-23 DIAGNOSIS — K5900 Constipation, unspecified: Secondary | ICD-10-CM | POA: Diagnosis not present

## 2018-07-29 DIAGNOSIS — E782 Mixed hyperlipidemia: Secondary | ICD-10-CM | POA: Diagnosis not present

## 2018-08-02 DIAGNOSIS — G4700 Insomnia, unspecified: Secondary | ICD-10-CM | POA: Diagnosis not present

## 2018-08-02 DIAGNOSIS — B354 Tinea corporis: Secondary | ICD-10-CM | POA: Diagnosis not present

## 2018-08-02 DIAGNOSIS — E559 Vitamin D deficiency, unspecified: Secondary | ICD-10-CM | POA: Diagnosis not present

## 2018-08-02 DIAGNOSIS — K5900 Constipation, unspecified: Secondary | ICD-10-CM | POA: Diagnosis not present

## 2018-08-05 DIAGNOSIS — B354 Tinea corporis: Secondary | ICD-10-CM | POA: Diagnosis not present

## 2018-08-05 DIAGNOSIS — B009 Herpesviral infection, unspecified: Secondary | ICD-10-CM | POA: Diagnosis not present

## 2018-08-17 DIAGNOSIS — Z23 Encounter for immunization: Secondary | ICD-10-CM | POA: Diagnosis not present

## 2018-08-18 DIAGNOSIS — H52223 Regular astigmatism, bilateral: Secondary | ICD-10-CM | POA: Diagnosis not present

## 2018-08-18 DIAGNOSIS — H5212 Myopia, left eye: Secondary | ICD-10-CM | POA: Diagnosis not present

## 2018-08-18 DIAGNOSIS — H5201 Hypermetropia, right eye: Secondary | ICD-10-CM | POA: Diagnosis not present

## 2018-08-18 DIAGNOSIS — H40053 Ocular hypertension, bilateral: Secondary | ICD-10-CM | POA: Diagnosis not present

## 2018-08-18 DIAGNOSIS — H25813 Combined forms of age-related cataract, bilateral: Secondary | ICD-10-CM | POA: Diagnosis not present

## 2018-08-18 DIAGNOSIS — H524 Presbyopia: Secondary | ICD-10-CM | POA: Diagnosis not present

## 2018-11-17 DIAGNOSIS — Z23 Encounter for immunization: Secondary | ICD-10-CM | POA: Diagnosis not present

## 2018-12-07 DIAGNOSIS — Z0000 Encounter for general adult medical examination without abnormal findings: Secondary | ICD-10-CM | POA: Diagnosis not present

## 2018-12-08 ENCOUNTER — Other Ambulatory Visit: Payer: Self-pay | Admitting: Family Medicine

## 2018-12-08 DIAGNOSIS — Z1231 Encounter for screening mammogram for malignant neoplasm of breast: Secondary | ICD-10-CM

## 2018-12-08 DIAGNOSIS — Z23 Encounter for immunization: Secondary | ICD-10-CM | POA: Diagnosis not present

## 2018-12-17 ENCOUNTER — Ambulatory Visit (HOSPITAL_COMMUNITY)
Admission: EM | Admit: 2018-12-17 | Discharge: 2018-12-17 | Disposition: A | Discharge status: Home / Self Care | Payer: PPO

## 2018-12-17 ENCOUNTER — Encounter (HOSPITAL_COMMUNITY): Payer: Self-pay

## 2018-12-17 DIAGNOSIS — R42 Dizziness and giddiness: Secondary | ICD-10-CM

## 2018-12-17 NOTE — ED Notes (Signed)
Pt refused all treatement and is leaving AMA, pt reports speaking to her PCP and was told to "go home". Provider aware.

## 2018-12-17 NOTE — ED Notes (Signed)
Patient refused blood draw, wanted to talk to the provider. Notified provider

## 2018-12-17 NOTE — ED Provider Notes (Signed)
[facility]    CSN: [id number] Arrival date & time: 12/17/18  [id number]      History   Chief Complaint Chief Complaint  Patient presents with  . Dizziness  . Hand Problem    HPI Martha Harrell is a 68 y.o. female.   Martha Harrell presents with complaints of episode of "walls spinning" this morning after she got up to the go the bathroom. She was able to hold onto her bed, and the episode resolved within approximately 5 minutes. No fall. No head injury. Didn't lose consciousness. No nausea, no chest pain. She now feels well and no further dizziness or spinning sensation. No headache. Denies any obvious source of bleeding or blood in stool.   At this point in history patient's phone rang, her PCP Dr. Ernie Hew is on the line. I then stepped out of room to allow for privacy. Patient concludes call a few minutes later stating she would like to continue to work with her PCP in regards to this episode and would like to defer any further evaluation here in UC.   Ambulatory out of UC without difficulty.      Past Medical History:  Diagnosis Date  . Allergy   . Anxiety   . Arthritis   . Depression   . GERD (gastroesophageal reflux disease)   . Hyperlipidemia   . Osteoporosis   . Thyroid disease     Patient Active Problem List   Diagnosis Date Noted  . Cognitive disorder 07/31/2012    Past Surgical History:  Procedure Laterality Date  . BACK SURGERY    . FOOT SURGERY Right    rod to R leg per tumor removal    OB History   No obstetric history on file.      Home Medications    Prior to Admission medications   Medication Sig Start Date End Date Taking? Authorizing Provider  clonazePAM (KLONOPIN) 1 MG tablet Take 2 mg by mouth at bedtime.  11/26/15   [provider]  DULoxetine (CYMBALTA) 60 MG capsule Take 60 mg by mouth daily.  12/01/15   [provider]  fluticasone (VERAMYST) 27.5 MCG/SPRAY nasal spray Place 2 sprays into the nose daily.  27.5 cg/spray/lc    [provider]  gabapentin (NEURONTIN) 400 MG capsule Take 400 mg by mouth See admin instructions. 400 mg in the morning and 1600 mg in the eveining    [provider]  ondansetron (ZOFRAN-ODT) 4 MG disintegrating tablet Take 4 mg by mouth as needed. 07/24/17   [provider]  sertraline (ZOLOFT) 50 MG tablet Take 50 mg by mouth daily. 11/03/18   [provider]  simvastatin (ZOCOR) 40 MG tablet Take 40 mg by mouth daily.  12/10/15   [provider]    Family History History reviewed. No pertinent family history.  Social History Social History   Tobacco Use  . Smoking status: Never Smoker  . Smokeless tobacco: Never Used  Substance Use Topics  . Alcohol use: No  . Drug use: No     Allergies   Ciprofloxacin, Ciprocinonide [fluocinolone], Nortriptyline, and Septra [sulfamethoxazole-trimethoprim]   Review of Systems Review of Systems   Physical Exam Triage Vital Signs ED Triage Vitals  Enc Vitals Group     BP 12/17/18 0941 129/76     Pulse Rate 12/17/18 0941 66     Resp 12/17/18 0941 16     Temp 12/17/18 0941 97.7 F (36.5 C)  Temp Source 12/17/18 0941 Oral     SpO2 12/17/18 0941 98 %     Weight --      Height --      Head Circumference --      Peak Flow --      Pain Score 12/17/18 0938 0     Pain Loc --      Pain Edu? --      Excl. in [place]? --    No data found.  Updated Vital Signs BP 129/76 (BP Location: Right Arm)   Pulse 66   Temp 97.7 F (36.5 C) (Oral)   Resp 16   SpO2 98%    Physical Exam  Patient is alert and appears oriented, recall of even appears intact. Full ROM of extremities and ambulatory without. Facial symmetry intact, speech is clear and coherent. Vital signs reviewed.   UC Treatments / Results  Labs (all labs ordered are listed, but only abnormal results are displayed) Labs Reviewed  CBC WITH DIFFERENTIAL/PLATELET  COMPREHENSIVE METABOLIC PANEL  TSH  CBG MONITORING,  ED    EKG   Radiology No results found.  Procedures Procedures (including critical care time)  Medications Ordered in UC Medications - No data to display  Initial Impression / Assessment and Plan / UC Course  I have reviewed the triage vital signs and the nursing notes.  Pertinent labs & imaging results that were available during my care of the patient were reviewed by me and considered in my medical decision making (see chart for details).     Patient declines further evaluation of episode of dizziness this morning, stating she will continue to work with her PCP. Return precautions encouraged. Ambulatory out of clinic without difficulty.    Final Clinical Impressions(s) / UC Diagnoses   Final diagnoses:  Dizziness     Discharge Instructions     I do recommend further evaluation of this episode which we are happy to provide here today.  Continue to follow with your primary care provider for recheck.  If any worsening of symptoms please return or go to the ER.    ED Prescriptions    None     PDMP not reviewed this encounter.   Zigmund Gottron, NP 12/17/18 [phone]

## 2018-12-17 NOTE — ED Triage Notes (Signed)
Pt states this morning she bend over and started feeling dizzy, pt sat down and the room started spinning and her hands were shaking.

## 2018-12-17 NOTE — Discharge Instructions (Signed)
I do recommend further evaluation of this episode which we are happy to provide here today.  Continue to follow with your primary care provider for recheck.  If any worsening of symptoms please return or go to the ER.

## 2018-12-27 DIAGNOSIS — R42 Dizziness and giddiness: Secondary | ICD-10-CM | POA: Diagnosis not present

## 2019-01-18 DIAGNOSIS — R5383 Other fatigue: Secondary | ICD-10-CM | POA: Diagnosis not present

## 2019-01-18 DIAGNOSIS — M810 Age-related osteoporosis without current pathological fracture: Secondary | ICD-10-CM | POA: Diagnosis not present

## 2019-01-18 DIAGNOSIS — E559 Vitamin D deficiency, unspecified: Secondary | ICD-10-CM | POA: Diagnosis not present

## 2019-01-25 DIAGNOSIS — E559 Vitamin D deficiency, unspecified: Secondary | ICD-10-CM | POA: Diagnosis not present

## 2019-01-25 DIAGNOSIS — M810 Age-related osteoporosis without current pathological fracture: Secondary | ICD-10-CM | POA: Diagnosis not present

## 2019-01-27 DIAGNOSIS — E782 Mixed hyperlipidemia: Secondary | ICD-10-CM | POA: Diagnosis not present

## 2019-01-27 DIAGNOSIS — Z79899 Other long term (current) drug therapy: Secondary | ICD-10-CM | POA: Diagnosis not present

## 2019-01-31 ENCOUNTER — Encounter: Payer: Self-pay | Admitting: Gastroenterology

## 2019-01-31 DIAGNOSIS — F331 Major depressive disorder, recurrent, moderate: Secondary | ICD-10-CM | POA: Diagnosis not present

## 2019-01-31 DIAGNOSIS — F419 Anxiety disorder, unspecified: Secondary | ICD-10-CM | POA: Diagnosis not present

## 2019-01-31 DIAGNOSIS — E559 Vitamin D deficiency, unspecified: Secondary | ICD-10-CM | POA: Diagnosis not present

## 2019-01-31 DIAGNOSIS — E782 Mixed hyperlipidemia: Secondary | ICD-10-CM | POA: Diagnosis not present

## 2019-01-31 DIAGNOSIS — Z6824 Body mass index (BMI) 24.0-24.9, adult: Secondary | ICD-10-CM | POA: Diagnosis not present

## 2019-02-06 DIAGNOSIS — B009 Herpesviral infection, unspecified: Secondary | ICD-10-CM | POA: Diagnosis not present

## 2019-03-07 ENCOUNTER — Encounter: Payer: Self-pay | Admitting: Gastroenterology

## 2019-03-07 ENCOUNTER — Ambulatory Visit (INDEPENDENT_AMBULATORY_CARE_PROVIDER_SITE_OTHER): Payer: PPO | Admitting: Gastroenterology

## 2019-03-07 VITALS — BP 120/70 | HR 83 | Ht 64.0 in | Wt 145.0 lb

## 2019-03-07 DIAGNOSIS — Z86010 Personal history of colonic polyps: Secondary | ICD-10-CM

## 2019-03-07 NOTE — Patient Instructions (Addendum)
Given your history of colon polyps in 2017 and on more distant colonoscopies, I recommend a colonoscopy in 2023. We will remind you to have a colonoscopy at that time.  Please let me know if you have any problems or concerns with your gut prior to that time.  If you are age 70 or older, your body mass index should be between 23-30. Your Body mass index is 24.89 kg/m. If this is out of the aforementioned range listed, please consider follow up with your Primary Care Provider.  If you are age 6 or younger, your body mass index should be between 19-25. Your Body mass index is 24.89 kg/m. If this is out of the aformentioned range listed, please consider follow up with your Primary Care Provider.

## 2019-03-07 NOTE — Progress Notes (Signed)
Referring Provider: Fanny Bien, MD Primary Care Physician:  Fanny Bien, MD  Reason for Consultation:  Discuss colonoscopy   IMPRESSION:  History of colon polyps for history of colon polyps    - no polyps on colonoscopy 2012, prior polyps noted on that report    - two ascending colon tubular adenomas on colonoscopy 6/17 History of gastroparesis, previously managed at [facility] No ongoing GI symptoms No known family history of colon cancer or polyps  Reviewed prior results from colonoscopy in 2017. Surveillance recommended in 5 years. Therefore, surveillance due 2023. Will plan earlier with any new symptoms.    PLAN: Colonoscopy 2023  Please see the "Patient Instructions" section for addition details about the plan.  HPI: Martha Harrell is a 69 y.o. female of anxiety, depression, recent fall in June 2019 with pelvic fracture, chronic pain, hyperlipidemia, osteoporosis, reflux disease, thyroid disease, arthritis, allergies and borderline overweight state, who has had memory issues and cognitive decline over the past several years. Her boyfriend of many years accompanies her to this appointment. The history is obtained through the patient, her boyfriend, and her review of the electronic health record including [place].   She cannot remember the date of her last colonoscopy. Was worried that she might need to have a colonoscopy soon.   GI ROS is negative. There is no dysphagia, odynophagia, regurgitation,  heartburn, nausea, abdominal pain, change in bowel habits, melena, hematochezia, or bright red blood per rectum. There is no anorexia or recent change in weight.   Records in [place] show prior endoscopic evaluation: EGD/Colonoscopy 06/2010: esophageal stricture s/p dilation, diverticulosis, no recurrence of polyps (history of adenomatous polyps on prior exams) EGD/Colonoscopy 07/2015: no EOE, daily PPI recommended, two acsending colon tubular adenomas,  surveillance colonoscopy in 5 years (Dr. Rolm Bookbinder)  She has a history of gastroparesis, previously evaluated at [facility] in 2013.  She did not tolerate Reglan at 10 mg or 5 mg due to excessive fatigue. Erythromycin or a gastric stimulator were discussed as possible options in the future.  She was also given Bentyl for functional abdominal pain. However, she denies any ongoing GI symptoms at this time.  No known family history of colon cancer or polyps. However, her parents died at a young age. No family history of uterine/endometrial cancer, pancreatic cancer or gastric/stomach cancer.   Past Medical History:  Diagnosis Date  . Allergy   . Anxiety   . Arthritis   . Depression   . GERD (gastroesophageal reflux disease)   . Hyperlipidemia   . Osteoporosis   . Thyroid disease     Past Surgical History:  Procedure Laterality Date  . BACK SURGERY    . FOOT SURGERY Right    rod to R leg per tumor removal    Current Outpatient Medications  Medication Sig Dispense Refill  . clonazePAM (KLONOPIN) 1 MG tablet Take 2 mg by mouth at bedtime.     . DULoxetine (CYMBALTA) 60 MG capsule Take 60 mg by mouth daily.     . fluticasone (VERAMYST) 27.5 MCG/SPRAY nasal spray Place 2 sprays into the nose daily. 27.5 cg/spray/lc    . gabapentin (NEURONTIN) 400 MG capsule Take 400 mg by mouth See admin instructions. 400 mg in the morning and 1600 mg in the eveining    . ondansetron (ZOFRAN-ODT) 4 MG disintegrating tablet Take 4 mg by mouth as needed.  0  . sertraline (ZOLOFT) 50 MG tablet Take 50 mg by mouth daily.    Marland Kitchen  simvastatin (ZOCOR) 40 MG tablet Take 40 mg by mouth daily.      No current facility-administered medications for this visit.    Allergies as of 03/07/2019 - Review Complete 12/17/2018  Allergen Reaction Noted  . Ciprofloxacin Other (See Comments) 09/22/2013  . Ciprocinonide [fluocinolone]  03/16/2013  . Nortriptyline  09/22/2013  . Septra [sulfamethoxazole-trimethoprim] Other  (See Comments) 03/16/2013    No family history on file.  Social History   Socioeconomic History  . Marital status: Divorced    Spouse name: Not on file  . Number of children: Not on file  . Years of education: Not on file  . Highest education level: Not on file  Occupational History  . Not on file  Tobacco Use  . Smoking status: Never Smoker  . Smokeless tobacco: Never Used  Substance and Sexual Activity  . Alcohol use: No  . Drug use: No  . Sexual activity: Not on file  Other Topics Concern  . Not on file  Social History Narrative  . Not on file   Social Determinants of Health   Financial Resource Strain:   . Difficulty of Paying Living Expenses: Not on file  Food Insecurity:   . Worried About Charity fundraiser in the Last Year: Not on file  . Ran Out of Food in the Last Year: Not on file  Transportation Needs:   . Lack of Transportation (Medical): Not on file  . Lack of Transportation (Non-Medical): Not on file  Physical Activity:   . Days of Exercise per Week: Not on file  . Minutes of Exercise per Session: Not on file  Stress:   . Feeling of Stress : Not on file  Social Connections:   . Frequency of Communication with Friends and Family: Not on file  . Frequency of Social Gatherings with Friends and Family: Not on file  . Attends Religious Services: Not on file  . Active Member of Clubs or Organizations: Not on file  . Attends Archivist Meetings: Not on file  . Marital Status: Not on file  Intimate Partner Violence:   . Fear of Current or Ex-Partner: Not on file  . Emotionally Abused: Not on file  . Physically Abused: Not on file  . Sexually Abused: Not on file    Review of Systems: 12 system ROS is negative except as noted above except for anxiety.   Physical Exam: General:   Alert,  well-nourished, pleasant and cooperative in NAD Head:  Normocephalic and atraumatic. Eyes:  Sclera clear, no icterus.   Conjunctiva pink. Ears:  Normal  auditory acuity. Nose:  No deformity, discharge,  or lesions. Mouth:  No deformity or lesions.   Neck:  Supple; no masses or thyromegaly. Abdomen:  Soft, nontender, nondistended, normal bowel sounds, no rebound or guarding. No hepatosplenomegaly.   Rectal:  Deferred  Msk:  Symmetrical. No boney deformities LAD: No inguinal or umbilical LAD Extremities:  No clubbing or edema. Neurologic:  Alert and  oriented x4;  grossly nonfocal Skin:  Intact without significant lesions or rashes. Psych:  Alert and cooperative. Normal mood and affect.     Latora Quarry L. Tarri Glenn, MD, MPH 03/07/2019, 12:57 PM

## 2019-03-29 DIAGNOSIS — M1611 Unilateral primary osteoarthritis, right hip: Secondary | ICD-10-CM | POA: Diagnosis not present

## 2019-03-29 DIAGNOSIS — M545 Low back pain: Secondary | ICD-10-CM | POA: Diagnosis not present

## 2019-04-26 DIAGNOSIS — E782 Mixed hyperlipidemia: Secondary | ICD-10-CM | POA: Diagnosis not present

## 2019-04-26 DIAGNOSIS — Z79899 Other long term (current) drug therapy: Secondary | ICD-10-CM | POA: Diagnosis not present

## 2019-04-27 DIAGNOSIS — D225 Melanocytic nevi of trunk: Secondary | ICD-10-CM | POA: Diagnosis not present

## 2019-04-27 DIAGNOSIS — S30861A Insect bite (nonvenomous) of abdominal wall, initial encounter: Secondary | ICD-10-CM | POA: Diagnosis not present

## 2019-04-27 DIAGNOSIS — L814 Other melanin hyperpigmentation: Secondary | ICD-10-CM | POA: Diagnosis not present

## 2019-04-27 DIAGNOSIS — L570 Actinic keratosis: Secondary | ICD-10-CM | POA: Diagnosis not present

## 2019-04-27 DIAGNOSIS — D1801 Hemangioma of skin and subcutaneous tissue: Secondary | ICD-10-CM | POA: Diagnosis not present

## 2019-04-27 DIAGNOSIS — L821 Other seborrheic keratosis: Secondary | ICD-10-CM | POA: Diagnosis not present

## 2019-05-02 DIAGNOSIS — E559 Vitamin D deficiency, unspecified: Secondary | ICD-10-CM | POA: Diagnosis not present

## 2019-05-02 DIAGNOSIS — K635 Polyp of colon: Secondary | ICD-10-CM | POA: Diagnosis not present

## 2019-05-02 DIAGNOSIS — E782 Mixed hyperlipidemia: Secondary | ICD-10-CM | POA: Diagnosis not present

## 2019-06-07 DIAGNOSIS — M25572 Pain in left ankle and joints of left foot: Secondary | ICD-10-CM | POA: Diagnosis not present

## 2019-06-16 DIAGNOSIS — F331 Major depressive disorder, recurrent, moderate: Secondary | ICD-10-CM | POA: Diagnosis not present

## 2019-06-16 DIAGNOSIS — F411 Generalized anxiety disorder: Secondary | ICD-10-CM | POA: Diagnosis not present

## 2019-06-16 DIAGNOSIS — B001 Herpesviral vesicular dermatitis: Secondary | ICD-10-CM | POA: Diagnosis not present

## 2019-07-04 DIAGNOSIS — K644 Residual hemorrhoidal skin tags: Secondary | ICD-10-CM | POA: Diagnosis not present

## 2019-07-04 DIAGNOSIS — K5900 Constipation, unspecified: Secondary | ICD-10-CM | POA: Diagnosis not present

## 2019-07-04 DIAGNOSIS — K602 Anal fissure, unspecified: Secondary | ICD-10-CM | POA: Diagnosis not present

## 2019-07-18 DIAGNOSIS — E559 Vitamin D deficiency, unspecified: Secondary | ICD-10-CM | POA: Diagnosis not present

## 2019-07-18 DIAGNOSIS — R5383 Other fatigue: Secondary | ICD-10-CM | POA: Diagnosis not present

## 2019-07-18 DIAGNOSIS — M810 Age-related osteoporosis without current pathological fracture: Secondary | ICD-10-CM | POA: Diagnosis not present

## 2019-07-21 DIAGNOSIS — K5900 Constipation, unspecified: Secondary | ICD-10-CM | POA: Diagnosis not present

## 2019-07-21 DIAGNOSIS — F331 Major depressive disorder, recurrent, moderate: Secondary | ICD-10-CM | POA: Diagnosis not present

## 2019-07-21 DIAGNOSIS — K644 Residual hemorrhoidal skin tags: Secondary | ICD-10-CM | POA: Diagnosis not present

## 2019-07-21 DIAGNOSIS — Z23 Encounter for immunization: Secondary | ICD-10-CM | POA: Diagnosis not present

## 2019-07-21 DIAGNOSIS — F411 Generalized anxiety disorder: Secondary | ICD-10-CM | POA: Diagnosis not present

## 2019-07-27 DIAGNOSIS — E559 Vitamin D deficiency, unspecified: Secondary | ICD-10-CM | POA: Diagnosis not present

## 2019-07-27 DIAGNOSIS — M810 Age-related osteoporosis without current pathological fracture: Secondary | ICD-10-CM | POA: Diagnosis not present

## 2019-08-04 ENCOUNTER — Ambulatory Visit: Payer: PPO | Admitting: Podiatry

## 2019-08-04 ENCOUNTER — Other Ambulatory Visit: Payer: Self-pay

## 2019-08-04 VITALS — BP 151/76 | Temp 77.0°F

## 2019-08-04 DIAGNOSIS — M205X2 Other deformities of toe(s) (acquired), left foot: Secondary | ICD-10-CM

## 2019-08-04 DIAGNOSIS — L84 Corns and callosities: Secondary | ICD-10-CM | POA: Diagnosis not present

## 2019-08-04 DIAGNOSIS — M79675 Pain in left toe(s): Secondary | ICD-10-CM

## 2019-08-08 NOTE — Progress Notes (Signed)
Subjective:   Patient ID: Martha Harrell, female   DOB: 69 y.o.   MRN: [id number]   HPI 69 year old female presents the office today for concerns of pain to her left fourth toe.  She states that she has a painful lesion is causing discomfort.  She did bring him x-ray today from [facility].  She said no recent treatment denies any swelling or redness.  She has no other concerns today.  No recent injury.   Review of Systems  All other systems reviewed and are negative.  Past Medical History:  Diagnosis Date  . Allergy   . Anxiety   . Arthritis   . Depression   . GERD (gastroesophageal reflux disease)   . Hyperlipidemia   . Osteoporosis   . Thyroid disease     Past Surgical History:  Procedure Laterality Date  . BACK SURGERY     c spine area  . FOOT SURGERY Right    rod to R leg per tumor removal     Current Outpatient Medications:  .  fluticasone (FLONASE) 50 MCG/ACT nasal spray, SPRAY 2 SPRAYS INTO EACH NOSTRIL EVERY DAY, Disp: , Rfl:  .  Hyoscyamine Sulfate SL 0.125 MG SUBL, Take by mouth., Disp: , Rfl:  .  lidocaine (XYLOCAINE) 2 % jelly, Apply topically., Disp: , Rfl:  .  acyclovir (ZOVIRAX) 200 MG capsule, Take by mouth., Disp: , Rfl:  .  buPROPion (WELLBUTRIN XL) 150 MG 24 hr tablet, Take 150 mg by mouth every morning., Disp: , Rfl:  .  clonazePAM (KLONOPIN) 1 MG tablet, Take 2 mg by mouth at bedtime. , Disp: , Rfl:  .  DULoxetine (CYMBALTA) 60 MG capsule, Take 60 mg by mouth daily. , Disp: , Rfl:  .  fluticasone (VERAMYST) 27.5 MCG/SPRAY nasal spray, Place 2 sprays into the nose daily as needed. 27.5 cg/spray/lc , Disp: , Rfl:  .  gabapentin (NEURONTIN) 100 MG capsule, TAKE 1 2 CAPSULE (100 MG) BY MOUTH AT BEDTIME, Disp: , Rfl:  .  gabapentin (NEURONTIN) 400 MG capsule, Take 800 mg by mouth at bedtime. , Disp: , Rfl:  .  Meth-Hyo-M Bl-Na Phos-Ph Sal (URIBEL) 118 MG CAPS, Take by mouth., Disp: , Rfl:  .  ondansetron (ZOFRAN-ODT) 4 MG disintegrating tablet, Take 4 mg by mouth  as needed., Disp: , Rfl: 0 .  pantoprazole (PROTONIX) 40 MG tablet, Take by mouth., Disp: , Rfl:  .  PROLIA 60 MG/ML SOSY injection, Inject into the skin once., Disp: , Rfl:  .  rosuvastatin (CRESTOR) 5 MG tablet, Take 5 mg by mouth daily., Disp: , Rfl:  .  sertraline (ZOLOFT) 50 MG tablet, Take 50 mg by mouth daily., Disp: , Rfl:  .  simvastatin (ZOCOR) 40 MG tablet, Take 40 mg by mouth daily. , Disp: , Rfl:  .  valACYclovir (VALTREX) 1000 MG tablet, Take 1,000 mg by mouth 3 (three) times daily., Disp: , Rfl:   Allergies  Allergen Reactions  . Ciprofloxacin Other (See Comments)    welts  . Ciprocinonide [Fluocinolone]   . Nortriptyline     Stomach problems  . Septra [Sulfamethoxazole-Trimethoprim] Other (See Comments)    unknown         Objective:  Physical Exam  General: AAO x3, NAD  Dermatological: Hyperkeratotic lesion the distal plantar aspect of the left fourth toe resulting in tenderness and upon debridement there is no underlying ulceration drainage or any signs of infection noted today.  Vascular: Dorsalis Pedis artery and Posterior Tibial artery  pedal pulses are 2/4 bilateral with immedate capillary fill time.  There is no pain with calf compression, swelling, warmth, erythema.   Neruologic: Grossly intact via light touch bilateral.   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  This is resulted in a painful hyperkeratotic lesion.  The tenderness is directly along the skin lesion.  No edema, erythema or any signs of infection muscular strength 5/5 in all groups tested bilateral.  Gait: Unassisted, Nonantalgic.       Assessment:   Left fourth toe deformity resulting hyperkeratotic     Plan:  -Treatment options discussed including all alternatives, risks, and complications -Etiology of symptoms were discussed -I independently reviewed the x-rays that she brought in.  There is no evidence of acute fracture identified but there is adductovarus present of  the toe. -I debrided the hyperkeratotic lesion without any complications or bleeding.  Dispensed offloading pads.  We will continue conservative care for now we discussed surgical intervention of treatment digit if needed.   Return if symptoms worsen or fail to improve.  Trula Slade DPM

## 2019-08-31 DIAGNOSIS — I10 Essential (primary) hypertension: Secondary | ICD-10-CM | POA: Diagnosis not present

## 2019-08-31 DIAGNOSIS — F411 Generalized anxiety disorder: Secondary | ICD-10-CM | POA: Diagnosis not present

## 2019-08-31 DIAGNOSIS — G4700 Insomnia, unspecified: Secondary | ICD-10-CM | POA: Diagnosis not present

## 2019-08-31 DIAGNOSIS — F331 Major depressive disorder, recurrent, moderate: Secondary | ICD-10-CM | POA: Diagnosis not present

## 2019-09-18 DIAGNOSIS — B029 Zoster without complications: Secondary | ICD-10-CM | POA: Diagnosis not present

## 2019-09-21 DIAGNOSIS — E559 Vitamin D deficiency, unspecified: Secondary | ICD-10-CM | POA: Diagnosis not present

## 2019-09-27 DIAGNOSIS — Z1331 Encounter for screening for depression: Secondary | ICD-10-CM | POA: Diagnosis not present

## 2019-09-27 DIAGNOSIS — Z0000 Encounter for general adult medical examination without abnormal findings: Secondary | ICD-10-CM | POA: Diagnosis not present

## 2019-09-27 DIAGNOSIS — Z1339 Encounter for screening examination for other mental health and behavioral disorders: Secondary | ICD-10-CM | POA: Diagnosis not present

## 2019-09-27 DIAGNOSIS — E559 Vitamin D deficiency, unspecified: Secondary | ICD-10-CM | POA: Diagnosis not present

## 2019-09-29 DIAGNOSIS — Z23 Encounter for immunization: Secondary | ICD-10-CM | POA: Diagnosis not present

## 2019-10-06 DIAGNOSIS — L709 Acne, unspecified: Secondary | ICD-10-CM | POA: Diagnosis not present

## 2019-10-12 ENCOUNTER — Encounter: Payer: Self-pay | Admitting: Neurology

## 2019-10-12 DIAGNOSIS — F331 Major depressive disorder, recurrent, moderate: Secondary | ICD-10-CM | POA: Diagnosis not present

## 2019-10-12 DIAGNOSIS — I10 Essential (primary) hypertension: Secondary | ICD-10-CM | POA: Diagnosis not present

## 2019-10-12 DIAGNOSIS — F411 Generalized anxiety disorder: Secondary | ICD-10-CM | POA: Diagnosis not present

## 2019-10-12 DIAGNOSIS — G4700 Insomnia, unspecified: Secondary | ICD-10-CM | POA: Diagnosis not present

## 2019-11-01 DIAGNOSIS — Z23 Encounter for immunization: Secondary | ICD-10-CM | POA: Diagnosis not present

## 2019-12-04 DIAGNOSIS — Z760 Encounter for issue of repeat prescription: Secondary | ICD-10-CM | POA: Diagnosis not present

## 2020-01-10 DIAGNOSIS — E782 Mixed hyperlipidemia: Secondary | ICD-10-CM | POA: Diagnosis not present

## 2020-01-10 DIAGNOSIS — I10 Essential (primary) hypertension: Secondary | ICD-10-CM | POA: Diagnosis not present

## 2020-01-12 DIAGNOSIS — F0390 Unspecified dementia without behavioral disturbance: Secondary | ICD-10-CM | POA: Diagnosis not present

## 2020-01-12 DIAGNOSIS — E782 Mixed hyperlipidemia: Secondary | ICD-10-CM | POA: Diagnosis not present

## 2020-01-12 DIAGNOSIS — I10 Essential (primary) hypertension: Secondary | ICD-10-CM | POA: Diagnosis not present

## 2020-01-12 DIAGNOSIS — R739 Hyperglycemia, unspecified: Secondary | ICD-10-CM | POA: Diagnosis not present

## 2020-01-12 DIAGNOSIS — E1169 Type 2 diabetes mellitus with other specified complication: Secondary | ICD-10-CM | POA: Diagnosis not present

## 2020-01-13 ENCOUNTER — Telehealth: Payer: Self-pay | Admitting: Neurology

## 2020-01-13 ENCOUNTER — Encounter: Payer: Self-pay | Admitting: Neurology

## 2020-01-13 ENCOUNTER — Ambulatory Visit: Payer: PPO | Admitting: Neurology

## 2020-01-13 ENCOUNTER — Other Ambulatory Visit: Payer: Self-pay

## 2020-01-13 VITALS — BP 156/73 | HR 73 | Resp 20 | Ht 64.0 in | Wt 148.0 lb

## 2020-01-13 DIAGNOSIS — R413 Other amnesia: Secondary | ICD-10-CM

## 2020-01-13 NOTE — Progress Notes (Signed)
NEUROLOGY CONSULTATION NOTE  Martha Harrell MRN: [id number] DOB: 27-Aug-1950  Referring provider: Dr. Rachell Cipro Primary care provider: Dr. Rachell Cipro  Reason for consult: dementia  Dear Dr Ernie Hew:  Thank you for your kind referral of Martha Harrell for consultation of the above symptoms. Although her history is well known to you, please allow me to reiterate it for the purpose of our medical record. The patient was accompanied to the clinic by her partner [name] who also provides collateral information. Records and images were personally reviewed where available.   HISTORY OF PRESENT ILLNESS: This is a 69 year old right-handed woman with a history of hyperlipidemia, depression, anxiety, presenting for evaluation of dementia. Her partner of 30 years [name] is present to provide additional information. He had reported worsening memory to her PCP last August. She states her memory is good. [name] started noticing changes around 10 years ago, it is unclear if he is having difficulty speaking in front of her, he is vague with describing concerns. He states she sometimes struggles with forgetting. For the past 2 years, she has struggled using the TV remote control. She manages her own medications, he does not check behind her. She is unable to tell us her medications today. She denies getting lost driving, [name] reports she stopped driving 2 years ago. She got lost one time and called him from a gas station. [name], production. She is independent with dressing and bathing. She reports her mood is "good, always good." [name] denies any paranoia or hallucinations. He denies any increased irritability, although in the office today she got irritable with memory screening (scored 7/30 on [place]) and during physical exam. She does not want to travel anymore, stating it is "too far," he does not know why. She denies any headaches, dizziness, diplopia, dysarthria/dysphagia, neck/back pain,  focal numbness/tingling/weakness, bowel/bladder dysfunction, anosmia, or tremors. Sleep is good, no REM behavior disorder. She is standing throughout the visit, she denies any pain and states she would rather stand than sit. No family history of dementia, no history of significant head injuries or alcohol use.    She was evaluated by neurologist [name] in 2019, notes reviewed. She was previously seen by neurologist [name]. She had Neuropsychological testing in 2018 indicating impaired performances across most cognitive domains and thinking skills. Compared to prior testing in 2012, declines were observed across most tasks. From an emotional standpoint, there appeared to be at least a moderate degree of recent depression and mild anxiety. 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 It is difficult to ascertain which is true without collateral information." MRI brain in 2019 no acute changes. There was a solitary chronic microhemorrhage in the left parietal cortex, a few punctate white matter hyperintensities, less than expected for age.    PAST MEDICAL HISTORY: Past Medical History:  Diagnosis Date  . Allergy   . Anxiety   . Arthritis   . Depression   . GERD (gastroesophageal reflux disease)   . Hyperlipidemia   . Osteoporosis   . Thyroid disease     PAST SURGICAL HISTORY: Past Surgical History:  Procedure Laterality Date  . BACK SURGERY  c spine area  . FOOT SURGERY Right    rod to R leg per tumor removal    MEDICATIONS: Current Outpatient Medications on File Prior to Visit  Medication Sig Dispense Refill  .  acyclovir (ZOVIRAX) 200 MG capsule Take by mouth.    Marland Kitchen buPROPion (WELLBUTRIN XL) 150 MG 24 hr tablet Take 150 mg by mouth every morning.    . clonazePAM (KLONOPIN) 1 MG tablet Take 2 mg by mouth at bedtime.     . DULoxetine (CYMBALTA) 60 MG capsule Take 60 mg by mouth daily.     . fluticasone (FLONASE) 50 MCG/ACT nasal spray SPRAY 2 SPRAYS INTO EACH NOSTRIL EVERY DAY    . fluticasone (VERAMYST) 27.5 MCG/SPRAY nasal spray Place 2 sprays into the nose daily as needed. 27.5 cg/spray/lc     . gabapentin (NEURONTIN) 100 MG capsule TAKE 1 2 CAPSULE (100 MG) BY MOUTH AT BEDTIME    . gabapentin (NEURONTIN) 400 MG capsule Take 800 mg by mouth at bedtime.     Marland Kitchen Hyoscyamine Sulfate SL 0.125 MG SUBL Take by mouth.    . lidocaine (XYLOCAINE) 2 % jelly Apply topically.    . Meth-Hyo-M Bl-Na Phos-Ph Sal (URIBEL) 118 MG CAPS Take by mouth.    . ondansetron (ZOFRAN-ODT) 4 MG disintegrating tablet Take 4 mg by mouth as needed.  0  . pantoprazole (PROTONIX) 40 MG tablet Take by mouth.    . PROLIA 60 MG/ML SOSY injection Inject into the skin once.    . rosuvastatin (CRESTOR) 5 MG tablet Take 5 mg by mouth daily.    . sertraline (ZOLOFT) 50 MG tablet Take 50 mg by mouth daily.    . simvastatin (ZOCOR) 40 MG tablet Take 40 mg by mouth daily.     . valACYclovir (VALTREX) 1000 MG tablet Take 1,000 mg by mouth 3 (three) times daily.     No current facility-administered medications on file prior to visit.    ALLERGIES: Allergies  Allergen Reactions  . Ciprofloxacin Other (See Comments)    welts  . Ciprocinonide [Fluocinolone]   . Nortriptyline     Stomach problems  . Septra [Sulfamethoxazole-Trimethoprim] Other (See Comments)    unknown    FAMILY HISTORY: Family History  Problem Relation Age of Onset  . Alzheimer's disease Father     SOCIAL HISTORY: Social History   Socioeconomic History  . Marital status: Divorced    Spouse name: Not on file  . Number of children: 1  . Years of education: Not  on file  . Highest education level: Not on file  Occupational History  . Occupation: retired  Tobacco Use  . Smoking status: Never Smoker  . Smokeless tobacco: Never Used  Vaping Use  . Vaping Use: Never used  Substance and Sexual Activity  . Alcohol use: No  . Drug use: No  . Sexual activity: Not on file  Other Topics Concern  . Not on file  Social History Narrative   Right handed   Social Determinants of Health   Financial Resource Strain:   . Difficulty of Paying Living Expenses: Not on file  Food Insecurity:   . Worried About Charity fundraiser in the Last Year: Not on file  . Ran Out of Food in the Last Year: Not on file  Transportation Needs:   . Lack of Transportation (Medical): Not on file  . Lack of Transportation (Non-Medical): Not on file  Physical Activity:   . Days of Exercise per Week: Not  on file  . Minutes of Exercise per Session: Not on file  Stress:   . Feeling of Stress : Not on file  Social Connections:   . Frequency of Communication with Friends and Family: Not on file  . Frequency of Social Gatherings with Friends and Family: Not on file  . Attends Religious Services: Not on file  . Active Member of Clubs or Organizations: Not on file  . Attends Archivist Meetings: Not on file  . Marital Status: Not on file  Intimate Partner Violence:   . Fear of Current or Ex-Partner: Not on file  . Emotionally Abused: Not on file  . Physically Abused: Not on file  . Sexually Abused: Not on file     PHYSICAL EXAM: Vitals:   01/13/20 1014  BP: (!) 156/73  Pulse: 73  Resp: 20  SpO2: 99%   General: No acute distress Head:  Normocephalic/atraumatic Skin/Extremities: No rash, no edema Neurological Exam: Mental status: alert and oriented to person, place. States it is September 2001. No dysarthria or aphasia, Fund of knowledge is appropriate.  Recent and remote memory are impaired.  Attention and concentration are reduced. Able to name objects,  difficulty with repetition. MOCA score 7/30. Montreal Cognitive Assessment  01/13/2020  Visuospatial/ Executive (0/5) 0  Naming (0/3) 2  Attention: Read list of digits (0/2) 2  Attention: Read list of letters (0/1) 0  Attention: Serial 7 subtraction starting at 100 (0/3) 0  Language: Repeat phrase (0/2) 0  Language : Fluency (0/1) 0  Abstraction (0/2) 1  Delayed Recall (0/5) 0  Orientation (0/6) 1  Total 6  Adjusted Score (based on education) 7    Cranial nerves: CN I: not tested CN II: pupils equal, round and reactive to light, visual fields intact CN III, IV, VI:  full range of motion, no nystagmus, no ptosis CN V: facial sensation intact CN VII: upper and lower face symmetric CN VIII: hearing intact to conversation CN IX, X: gag intact, uvula midline CN XI: sternocleidomastoid and trapezius muscles intact CN XII: tongue midline Bulk & Tone: normal, no fasciculations. Motor: 5/5 throughout with no pronator drift. Sensation: intact to light touch, cold, pin, vibration sense.  No extinction to double simultaneous stimulation.  Romberg test negative Deep Tendon Reflexes: +2 throughout Cerebellar: no incoordination on finger to nose testing Gait: narrow-based and steady, able to tandem walk adequately. Tremor: she has a mild side to side head tremor. No tremor noted in extremities.   IMPRESSION: This is a 69 year old right-handed woman with a history of hyperlipidemia, depression, anxiety, presenting for evaluation of dementia. Her neurological exam is non-focal, MOCA score 7/30. Her partner is present today but it is unclear if he is unable to speak freely in front of her, however with this level of performance, concern again is raised similar to prior Neuropsychological testing in 2018 with a diagnosis of Major Neurocognitive disorder, however it is unclear if she is truly cognitive impaired at the level of those with dementia in assisted living facilities (which does not seem to  be the case with their history today), or test results represent an inaccurate assessment of her actual cognitive abilities. Repeat Neuropsychological testing will be ordered. MRI brain in 2019 did not show any acute changes, there was minimal chronic microvascular disease. She has minimal insight into her condition and does not feel she has any memory issues. She does not drive. Follow-up in 6 months, they know to call for any changes.  Thank you for allowing me to participate in the care of this patient. Please do not hesitate to call for any questions or concerns.   Ellouise Newer, M.D.  CC: Dr. Ernie Hew

## 2020-01-13 NOTE — Patient Instructions (Signed)
1. Schedule Neurocognitive testing  2. Please call our office to update your medication list  3. Follow-up in 6-8 months, call for any changes   RECOMMENDATIONS FOR ALL PATIENTS WITH MEMORY PROBLEMS: 1. Continue to exercise (Recommend 30 minutes of walking everyday, or 3 hours every week) 2. Increase social interactions - continue going to [place] and enjoy social gatherings with friends and family 3. Eat healthy, avoid fried foods and eat more fruits and vegetables 4. Maintain adequate blood pressure, blood sugar, and blood cholesterol level. Reducing the risk of stroke and cardiovascular disease also helps promoting better memory. 5. Avoid stressful situations. Live a simple life and avoid aggravations. Organize your time and prepare for the next day in anticipation. 6. Sleep well, avoid any interruptions of sleep and avoid any distractions in the bedroom that may interfere with adequate sleep quality 7. Avoid sugar, avoid sweets as there is a strong link between excessive sugar intake, diabetes, and cognitive impairment The Mediterranean diet has been shown to help patients reduce the risk of progressive memory disorders and reduces cardiovascular risk. This includes eating fish, eat fruits and green leafy vegetables, nuts like almonds and hazelnuts, walnuts, and also use olive oil. Avoid fast foods and fried foods as much as possible. Avoid sweets and sugar as sugar use has been linked to worsening of memory function.

## 2020-01-13 NOTE — Telephone Encounter (Signed)
Patient was told to call back in and update the patient's medication list. She takes Rosuvastatin 10 MG per day, sertraline 100 mg tablet per day, Vitamin D3 180mcg per day, gabapentin 200mg  per day, and valsaratan 40 mg per day.

## 2020-01-13 NOTE — Telephone Encounter (Signed)
Chart updated

## 2020-01-13 NOTE — Progress Notes (Signed)
Will call to update list when they get home.

## 2020-01-17 DIAGNOSIS — I10 Essential (primary) hypertension: Secondary | ICD-10-CM | POA: Diagnosis not present

## 2020-01-17 DIAGNOSIS — B029 Zoster without complications: Secondary | ICD-10-CM | POA: Diagnosis not present

## 2020-01-24 DIAGNOSIS — M810 Age-related osteoporosis without current pathological fracture: Secondary | ICD-10-CM | POA: Diagnosis not present

## 2020-01-24 DIAGNOSIS — R5383 Other fatigue: Secondary | ICD-10-CM | POA: Diagnosis not present

## 2020-01-24 DIAGNOSIS — E559 Vitamin D deficiency, unspecified: Secondary | ICD-10-CM | POA: Diagnosis not present

## 2020-02-14 DIAGNOSIS — B029 Zoster without complications: Secondary | ICD-10-CM | POA: Diagnosis not present

## 2020-03-05 ENCOUNTER — Encounter: Payer: Self-pay | Admitting: Psychology

## 2020-03-05 ENCOUNTER — Ambulatory Visit: Payer: PPO | Admitting: Psychology

## 2020-03-05 ENCOUNTER — Ambulatory Visit (INDEPENDENT_AMBULATORY_CARE_PROVIDER_SITE_OTHER): Payer: PPO | Admitting: Psychology

## 2020-03-05 ENCOUNTER — Other Ambulatory Visit: Payer: Self-pay

## 2020-03-05 DIAGNOSIS — G309 Alzheimer's disease, unspecified: Secondary | ICD-10-CM | POA: Diagnosis not present

## 2020-03-05 DIAGNOSIS — F0280 Dementia in other diseases classified elsewhere without behavioral disturbance: Secondary | ICD-10-CM

## 2020-03-05 DIAGNOSIS — F330 Major depressive disorder, recurrent, mild: Secondary | ICD-10-CM | POA: Diagnosis not present

## 2020-03-05 DIAGNOSIS — R4189 Other symptoms and signs involving cognitive functions and awareness: Secondary | ICD-10-CM

## 2020-03-05 DIAGNOSIS — F411 Generalized anxiety disorder: Secondary | ICD-10-CM | POA: Diagnosis not present

## 2020-03-05 NOTE — Progress Notes (Addendum)
   Psychometrician Note   Cognitive testing was administered to [employer] by Milana Kidney, B.S. (psychometrist) under the supervision of Dr. Christia Reading, Ph.D., licensed psychologist on 03/05/20.   The battery of tests administered was selected by Dr. Christia Reading, Ph.D. with consideration to Ms. Burmester's current level of functioning, the nature of her symptoms, emotional and behavioral responses during interview, level of literacy, observed level of motivation/effort, and the nature of the referral question. This battery was communicated to the psychometrist. Communication between Dr. Christia Reading, Ph.D. and the psychometrist was ongoing throughout the evaluation and Dr. Christia Reading, Ph.D. was immediately accessible at all times. Dr. Christia Reading, Ph.D. provided supervision to the psychometrist on the date of this service to the extent necessary to assure the quality of all services provided.   A total of 30 minutes of billable time were spent face-to-face with Ms. Bramhall by the psychometrist. This includes both test administration and scoring time. Billing for these services is reflected in the clinical report generated by Dr. Christia Reading, Ph.D..  This note reflects time spent with the psychometrician and does not include test scores or any clinical interpretations made by Dr. Melvyn Novas. The full report will follow in a separate note.
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
## 2020-03-05 NOTE — Patient Instructions (Signed)
I would encourage Ms. Holthaus to discuss options with Dr. Delice Lesch surrounding medications commonly prescribed to treat memory loss (e.Unknown, donepezil/Aricept). These medications have been shown to slow progression of functional decline in some individuals. However, it is important to highlight that no current treatment can stop or reverse cognitive decline in the face of a neurodegenerative condition.   Should there be a continued progression of her current deficits over time, Ms. Maj is unlikely to regain any independent living skills lost. Therefore, it is recommended that she remain as involved as possible in all aspects of household chores, finances, and medication management, with supervision to ensure adequate performance. The latter point will be especially important surrounding medication management. She will likely benefit from the establishment and maintenance of a routine in order to maximize her functional abilities over time.  It will be important for Ms. Haub to have another person with her when in situations where she may need to process information, weigh the pros and cons of different options, and make decisions, in order to ensure that she fully understands and recalls all information to be considered.  If not already done, Ms. Fontes and her family may want to discuss her wishes regarding durable power of attorney and medical decision making, so that she can have input into these choices. Additionally, they may wish to discuss future plans for caretaking and seek out community options for in home/residential care should they become necessary.  Ms. Travaglini is encouraged to speak with her prescribing physician/psychiatrist regarding medication adjustments to optimally manage symptoms of irritability, anxiety, and depression.   Performance across neurocognitive testing is not a strong predictor of an individual's safety operating a motor vehicle. However, I do agree with [name] that  abstaining from driving behaviors is in Ms. Escher's best interest, especially since there is a prior history of her getting lost. Should she or her family wish to pursue a formalized driving evaluation, they would be encouraged to contact [employer] in [place], [place] at [phone]. Another option would be through [facility]; however, the latter would likely require a referral from a medical doctor. [facility] can be reached directly at [phone].   Information important to remember should be provided in a written format in all instances. This should be placed in a commonly frequented and highly visible location of her home to help promote recall.

## 2020-03-09 DIAGNOSIS — R739 Hyperglycemia, unspecified: Secondary | ICD-10-CM | POA: Diagnosis not present

## 2020-03-09 DIAGNOSIS — E782 Mixed hyperlipidemia: Secondary | ICD-10-CM | POA: Diagnosis not present

## 2020-03-09 DIAGNOSIS — I10 Essential (primary) hypertension: Secondary | ICD-10-CM | POA: Diagnosis not present

## 2020-03-12 ENCOUNTER — Encounter: Payer: PPO | Admitting: Psychology

## 2020-03-13 DIAGNOSIS — R739 Hyperglycemia, unspecified: Secondary | ICD-10-CM | POA: Diagnosis not present

## 2020-03-13 DIAGNOSIS — F411 Generalized anxiety disorder: Secondary | ICD-10-CM | POA: Diagnosis not present

## 2020-03-13 DIAGNOSIS — I10 Essential (primary) hypertension: Secondary | ICD-10-CM | POA: Diagnosis not present

## 2020-03-13 DIAGNOSIS — E782 Mixed hyperlipidemia: Secondary | ICD-10-CM | POA: Diagnosis not present

## 2020-03-16 DIAGNOSIS — Z780 Asymptomatic menopausal state: Secondary | ICD-10-CM | POA: Diagnosis not present

## 2020-03-16 DIAGNOSIS — M8589 Other specified disorders of bone density and structure, multiple sites: Secondary | ICD-10-CM | POA: Diagnosis not present

## 2020-03-16 DIAGNOSIS — Z1231 Encounter for screening mammogram for malignant neoplasm of breast: Secondary | ICD-10-CM | POA: Diagnosis not present

## 2020-03-29 DIAGNOSIS — Z23 Encounter for immunization: Secondary | ICD-10-CM | POA: Diagnosis not present

## 2020-04-03 DIAGNOSIS — M810 Age-related osteoporosis without current pathological fracture: Secondary | ICD-10-CM | POA: Diagnosis not present

## 2020-04-03 DIAGNOSIS — E559 Vitamin D deficiency, unspecified: Secondary | ICD-10-CM | POA: Diagnosis not present

## 2020-04-12 DIAGNOSIS — G4700 Insomnia, unspecified: Secondary | ICD-10-CM | POA: Diagnosis not present

## 2020-04-12 DIAGNOSIS — F331 Major depressive disorder, recurrent, moderate: Secondary | ICD-10-CM | POA: Diagnosis not present

## 2020-04-12 DIAGNOSIS — F0390 Unspecified dementia without behavioral disturbance: Secondary | ICD-10-CM | POA: Diagnosis not present

## 2020-04-12 DIAGNOSIS — F411 Generalized anxiety disorder: Secondary | ICD-10-CM | POA: Diagnosis not present

## 2020-04-24 DIAGNOSIS — G4700 Insomnia, unspecified: Secondary | ICD-10-CM | POA: Diagnosis not present

## 2020-04-24 DIAGNOSIS — F0390 Unspecified dementia without behavioral disturbance: Secondary | ICD-10-CM | POA: Diagnosis not present

## 2020-04-24 DIAGNOSIS — F331 Major depressive disorder, recurrent, moderate: Secondary | ICD-10-CM | POA: Diagnosis not present

## 2020-04-24 DIAGNOSIS — F325 Major depressive disorder, single episode, in full remission: Secondary | ICD-10-CM | POA: Diagnosis not present

## 2020-04-24 DIAGNOSIS — F411 Generalized anxiety disorder: Secondary | ICD-10-CM | POA: Diagnosis not present

## 2020-05-24 DIAGNOSIS — I10 Essential (primary) hypertension: Secondary | ICD-10-CM | POA: Diagnosis not present

## 2020-05-24 DIAGNOSIS — F0390 Unspecified dementia without behavioral disturbance: Secondary | ICD-10-CM | POA: Diagnosis not present

## 2020-05-24 DIAGNOSIS — G4700 Insomnia, unspecified: Secondary | ICD-10-CM | POA: Diagnosis not present

## 2020-05-24 DIAGNOSIS — F331 Major depressive disorder, recurrent, moderate: Secondary | ICD-10-CM | POA: Diagnosis not present

## 2020-05-24 DIAGNOSIS — F411 Generalized anxiety disorder: Secondary | ICD-10-CM | POA: Diagnosis not present

## 2020-07-05 DIAGNOSIS — N289 Disorder of kidney and ureter, unspecified: Secondary | ICD-10-CM | POA: Diagnosis not present

## 2020-07-05 DIAGNOSIS — R739 Hyperglycemia, unspecified: Secondary | ICD-10-CM | POA: Diagnosis not present

## 2020-07-05 DIAGNOSIS — E782 Mixed hyperlipidemia: Secondary | ICD-10-CM | POA: Diagnosis not present

## 2020-07-12 DIAGNOSIS — I10 Essential (primary) hypertension: Secondary | ICD-10-CM | POA: Diagnosis not present

## 2020-07-12 DIAGNOSIS — E782 Mixed hyperlipidemia: Secondary | ICD-10-CM | POA: Diagnosis not present

## 2020-07-12 DIAGNOSIS — R739 Hyperglycemia, unspecified: Secondary | ICD-10-CM | POA: Diagnosis not present

## 2020-08-28 DIAGNOSIS — F411 Generalized anxiety disorder: Secondary | ICD-10-CM | POA: Diagnosis not present

## 2020-08-28 DIAGNOSIS — F0390 Unspecified dementia without behavioral disturbance: Secondary | ICD-10-CM | POA: Diagnosis not present

## 2020-08-28 DIAGNOSIS — I10 Essential (primary) hypertension: Secondary | ICD-10-CM | POA: Diagnosis not present

## 2020-09-24 ENCOUNTER — Ambulatory Visit: Payer: PPO | Admitting: Neurology

## 2020-09-26 DIAGNOSIS — M810 Age-related osteoporosis without current pathological fracture: Secondary | ICD-10-CM | POA: Diagnosis not present

## 2020-09-26 DIAGNOSIS — E559 Vitamin D deficiency, unspecified: Secondary | ICD-10-CM | POA: Diagnosis not present

## 2020-09-26 DIAGNOSIS — R5383 Other fatigue: Secondary | ICD-10-CM | POA: Diagnosis not present

## 2020-10-02 DIAGNOSIS — Z1331 Encounter for screening for depression: Secondary | ICD-10-CM | POA: Diagnosis not present

## 2020-10-02 DIAGNOSIS — F0390 Unspecified dementia without behavioral disturbance: Secondary | ICD-10-CM | POA: Diagnosis not present

## 2020-10-02 DIAGNOSIS — Z0000 Encounter for general adult medical examination without abnormal findings: Secondary | ICD-10-CM | POA: Diagnosis not present

## 2020-10-02 DIAGNOSIS — Z1339 Encounter for screening examination for other mental health and behavioral disorders: Secondary | ICD-10-CM | POA: Diagnosis not present

## 2020-10-09 DIAGNOSIS — E559 Vitamin D deficiency, unspecified: Secondary | ICD-10-CM | POA: Diagnosis not present

## 2020-10-09 DIAGNOSIS — M810 Age-related osteoporosis without current pathological fracture: Secondary | ICD-10-CM | POA: Diagnosis not present

## 2020-10-22 DIAGNOSIS — F411 Generalized anxiety disorder: Secondary | ICD-10-CM | POA: Diagnosis not present

## 2020-10-22 DIAGNOSIS — F331 Major depressive disorder, recurrent, moderate: Secondary | ICD-10-CM | POA: Diagnosis not present

## 2020-10-22 DIAGNOSIS — F0390 Unspecified dementia without behavioral disturbance: Secondary | ICD-10-CM | POA: Diagnosis not present

## 2020-10-22 DIAGNOSIS — E559 Vitamin D deficiency, unspecified: Secondary | ICD-10-CM | POA: Diagnosis not present

## 2020-11-22 DIAGNOSIS — Z23 Encounter for immunization: Secondary | ICD-10-CM | POA: Diagnosis not present

## 2020-12-05 DIAGNOSIS — L0889 Other specified local infections of the skin and subcutaneous tissue: Secondary | ICD-10-CM | POA: Diagnosis not present

## 2020-12-05 DIAGNOSIS — S60410A Abrasion of right index finger, initial encounter: Secondary | ICD-10-CM | POA: Diagnosis not present

## 2020-12-17 DIAGNOSIS — M25551 Pain in right hip: Secondary | ICD-10-CM | POA: Diagnosis not present

## 2020-12-17 DIAGNOSIS — M47816 Spondylosis without myelopathy or radiculopathy, lumbar region: Secondary | ICD-10-CM | POA: Diagnosis not present

## 2020-12-18 DIAGNOSIS — N3000 Acute cystitis without hematuria: Secondary | ICD-10-CM | POA: Diagnosis not present

## 2020-12-28 DIAGNOSIS — E782 Mixed hyperlipidemia: Secondary | ICD-10-CM | POA: Diagnosis not present

## 2020-12-28 DIAGNOSIS — R739 Hyperglycemia, unspecified: Secondary | ICD-10-CM | POA: Diagnosis not present

## 2020-12-28 DIAGNOSIS — E559 Vitamin D deficiency, unspecified: Secondary | ICD-10-CM | POA: Diagnosis not present

## 2021-01-01 DIAGNOSIS — R7303 Prediabetes: Secondary | ICD-10-CM | POA: Diagnosis not present

## 2021-01-01 DIAGNOSIS — E782 Mixed hyperlipidemia: Secondary | ICD-10-CM | POA: Diagnosis not present

## 2021-01-01 DIAGNOSIS — E559 Vitamin D deficiency, unspecified: Secondary | ICD-10-CM | POA: Diagnosis not present

## 2021-02-08 DIAGNOSIS — G4700 Insomnia, unspecified: Secondary | ICD-10-CM | POA: Diagnosis not present

## 2021-06-09 ENCOUNTER — Emergency Department (HOSPITAL_COMMUNITY)
Admission: EM | Admit: 2021-06-09 | Discharge: 2021-06-10 | Discharge status: Against Medical Advice | Payer: PPO | Attending: Emergency Medicine | Admitting: Emergency Medicine

## 2021-06-09 ENCOUNTER — Other Ambulatory Visit: Payer: Self-pay

## 2021-06-09 ENCOUNTER — Encounter (HOSPITAL_COMMUNITY): Payer: Self-pay | Admitting: Emergency Medicine

## 2021-06-09 DIAGNOSIS — F0390 Unspecified dementia without behavioral disturbance: Secondary | ICD-10-CM | POA: Diagnosis not present

## 2021-06-09 DIAGNOSIS — R413 Other amnesia: Secondary | ICD-10-CM | POA: Insufficient documentation

## 2021-06-09 DIAGNOSIS — Z5321 Procedure and treatment not carried out due to patient leaving prior to being seen by health care provider: Secondary | ICD-10-CM | POA: Insufficient documentation

## 2021-06-09 LAB — CBC WITH DIFFERENTIAL/PLATELET
Abs Immature Granulocytes: 0.04 10*3/uL (ref 0.00–0.07)
Basophils Absolute: 0 10*3/uL (ref 0.0–0.1)
Basophils Relative: 0 %
Eosinophils Absolute: 0.1 10*3/uL (ref 0.0–0.5)
Eosinophils Relative: 1 %
HCT: 42 % (ref 36.0–46.0)
Hemoglobin: 14.1 g/dL (ref 12.0–15.0)
Immature Granulocytes: 0 %
Lymphocytes Relative: 15 %
Lymphs Abs: 1.6 10*3/uL (ref 0.7–4.0)
MCH: 30.9 pg (ref 26.0–34.0)
MCHC: 33.6 g/dL (ref 30.0–36.0)
MCV: 91.9 fL (ref 80.0–100.0)
Monocytes Absolute: 0.8 10*3/uL (ref 0.1–1.0)
Monocytes Relative: 7 %
Neutro Abs: 8.1 10*3/uL — ABNORMAL HIGH (ref 1.7–7.7)
Neutrophils Relative %: 77 %
Platelets: 201 10*3/uL (ref 150–400)
RBC: 4.57 MIL/uL (ref 3.87–5.11)
RDW: 13.2 % (ref 11.5–15.5)
WBC: 10.6 10*3/uL — ABNORMAL HIGH (ref 4.0–10.5)
nRBC: 0 % (ref 0.0–0.2)

## 2021-06-09 LAB — COMPREHENSIVE METABOLIC PANEL
ALT: 25 U/L (ref 0–44)
AST: 25 U/L (ref 15–41)
Albumin: 4 g/dL (ref 3.5–5.0)
Alkaline Phosphatase: 50 U/L (ref 38–126)
Anion gap: 10 (ref 5–15)
BUN: 18 mg/dL (ref 8–23)
CO2: 23 mmol/L (ref 22–32)
Calcium: 8.9 mg/dL (ref 8.9–10.3)
Chloride: 105 mmol/L (ref 98–111)
Creatinine, Ser: 0.85 mg/dL (ref 0.44–1.00)
GFR, Estimated: >60 mL/min (ref >60)
Glucose, Bld: 144 mg/dL — ABNORMAL HIGH (ref 70–99)
Potassium: 3.7 mmol/L (ref 3.5–5.1)
Sodium: 138 mmol/L (ref 135–145)
Total Bilirubin: 0.6 mg/dL (ref 0.3–1.2)
Total Protein: 6.9 g/dL (ref 6.5–8.1)

## 2021-06-09 NOTE — ED Triage Notes (Signed)
Pt brought to ED by husband after being told by doctor via text message that she may have acute on chronic memory issues d/t infection. Husband states pt has hx of dementia but has recently been going to sleep early and waking up in middle of night, displaying disorientation to time. Pt has also been imaging things and talking to people who are not physically present. Pt denies any urinary pain of frequency. Husband states memory issues have been increasing over past six months. Cannot state driving factor that warranted doctor to tell him to bring patient to ED. ?

## 2021-06-10 NOTE — ED Notes (Signed)
Patient husband states he is taking his wife home d/t wait time ?

## 2021-08-22 ENCOUNTER — Encounter: Payer: Self-pay | Admitting: Gastroenterology

## 2021-10-17 ENCOUNTER — Encounter: Payer: Self-pay | Admitting: Gastroenterology

## 2021-11-19 ENCOUNTER — Ambulatory Visit (AMBULATORY_SURGERY_CENTER): Payer: Self-pay | Admitting: *Deleted

## 2021-11-19 ENCOUNTER — Encounter: Payer: Self-pay | Admitting: Gastroenterology

## 2021-11-19 VITALS — Ht 67.0 in | Wt 145.0 lb

## 2021-11-19 DIAGNOSIS — Z86010 Personal history of colonic polyps: Secondary | ICD-10-CM

## 2021-11-19 NOTE — Progress Notes (Signed)
Pre visit completed in person.  Patient with cognitive decline, was accompanied by partner Jennye Boroughs) Mr Sabra Heck is not HCPOA. Patient was insistent that she did not want colonoscopy. Advised Mr Sabra Heck to contact patient's PC to discuss further as he does not have legal authority and patient is stating she does not wish to proceed. Instructions provided , advised patient would need to be in agreement at time of colonoscopy, or a HCPOA would need to be present.  No egg or soy allergy known to patient  No issues known to pt with past sedation with any surgeries or procedures Patient denies ever being told they had issues or difficulty with intubation  No FH of Malignant Hyperthermia Pt is not on diet pills Pt is not on  home 02  Pt is not on blood thinners  Pt denies issues with constipation  No A fib or A flutter Pt instructed to use [email or web address] or [employer] for a price reduction on prep

## 2021-11-20 ENCOUNTER — Encounter: Payer: PPO | Admitting: Gastroenterology

## 2021-11-20 ENCOUNTER — Telehealth: Payer: Self-pay | Admitting: Gastroenterology

## 2021-11-20 NOTE — Telephone Encounter (Signed)
Martha Harrell called to cancel wife procedure on 11/25/2021 with Dr. Tarri Glenn

## 2021-11-25 ENCOUNTER — Encounter: Payer: PPO | Admitting: Gastroenterology
